# Patient Record
Sex: Female | Born: 1940 | Race: Black or African American | Hispanic: No | Smoking: Former smoker
Health system: Southern US, Community
[De-identification: ages and names within clinical notes are randomized; demographics above are authoritative.]

## PROBLEM LIST (undated history)

## (undated) DIAGNOSIS — R7309 Other abnormal glucose: Secondary | ICD-10-CM

## (undated) DIAGNOSIS — G4730 Sleep apnea, unspecified: Secondary | ICD-10-CM

## (undated) DIAGNOSIS — I10 Essential (primary) hypertension: Secondary | ICD-10-CM

## (undated) DIAGNOSIS — M60819 Other myositis, unspecified shoulder: Secondary | ICD-10-CM

## (undated) DIAGNOSIS — D649 Anemia, unspecified: Secondary | ICD-10-CM

## (undated) DIAGNOSIS — M25562 Pain in left knee: Secondary | ICD-10-CM

## (undated) DIAGNOSIS — R0982 Postnasal drip: Secondary | ICD-10-CM

## (undated) DIAGNOSIS — M5432 Sciatica, left side: Secondary | ICD-10-CM

## (undated) DIAGNOSIS — M150 Primary generalized (osteo)arthritis: Secondary | ICD-10-CM

## (undated) DIAGNOSIS — M170 Bilateral primary osteoarthritis of knee: Secondary | ICD-10-CM

## (undated) DIAGNOSIS — E7849 Other hyperlipidemia: Secondary | ICD-10-CM

## (undated) DIAGNOSIS — E559 Vitamin D deficiency, unspecified: Secondary | ICD-10-CM

## (undated) DIAGNOSIS — J309 Allergic rhinitis, unspecified: Secondary | ICD-10-CM

## (undated) DIAGNOSIS — G5601 Carpal tunnel syndrome, right upper limb: Secondary | ICD-10-CM

## (undated) DIAGNOSIS — G8929 Other chronic pain: Secondary | ICD-10-CM

## (undated) DIAGNOSIS — M542 Cervicalgia: Secondary | ICD-10-CM

## (undated) DIAGNOSIS — M5489 Other dorsalgia: Secondary | ICD-10-CM

## (undated) DIAGNOSIS — Z6834 Body mass index (BMI) 34.0-34.9, adult: Secondary | ICD-10-CM

## (undated) DIAGNOSIS — L989 Disorder of the skin and subcutaneous tissue, unspecified: Secondary | ICD-10-CM

## (undated) DIAGNOSIS — M353 Polymyalgia rheumatica: Secondary | ICD-10-CM

## (undated) DIAGNOSIS — M25569 Pain in unspecified knee: Secondary | ICD-10-CM

## (undated) DIAGNOSIS — M25552 Pain in left hip: Secondary | ICD-10-CM

## (undated) DIAGNOSIS — R011 Cardiac murmur, unspecified: Secondary | ICD-10-CM

## (undated) DIAGNOSIS — M7918 Myalgia, other site: Secondary | ICD-10-CM

## (undated) DIAGNOSIS — M1710 Unilateral primary osteoarthritis, unspecified knee: Secondary | ICD-10-CM

## (undated) DIAGNOSIS — M179 Osteoarthritis of knee, unspecified: Secondary | ICD-10-CM

## (undated) HISTORY — DX: Other dorsalgia: M54.89

## (undated) HISTORY — DX: Bilateral primary osteoarthritis of knee: M17.0

## (undated) HISTORY — DX: Cardiac murmur, unspecified: R01.1

## (undated) HISTORY — DX: Postnasal drip: R09.82

## (undated) HISTORY — DX: Primary generalized (osteo)arthritis: M15.0

## (undated) HISTORY — DX: Pain in left hip: M25.552

## (undated) HISTORY — DX: Polymyalgia rheumatica: M35.3

## (undated) HISTORY — DX: Pain in left knee: M25.562

## (undated) HISTORY — DX: Other abnormal glucose: R73.09

## (undated) HISTORY — DX: Body mass index (BMI) 34.0-34.9, adult: Z68.34

## (undated) HISTORY — DX: Osteoarthritis of knee, unspecified: M17.9

## (undated) HISTORY — DX: Disorder of the skin and subcutaneous tissue, unspecified: L98.9

## (undated) HISTORY — DX: Sciatica, left side: M54.32

## (undated) HISTORY — DX: Myalgia, other site: M79.18

## (undated) HISTORY — DX: Unilateral primary osteoarthritis, unspecified knee: M17.10

## (undated) HISTORY — DX: Sleep apnea, unspecified: G47.30

## (undated) HISTORY — DX: Other chronic pain: G89.29

## (undated) HISTORY — DX: Allergic rhinitis, unspecified: J30.9

## (undated) HISTORY — DX: Essential (primary) hypertension: I10

## (undated) HISTORY — DX: Anemia, unspecified: D64.9

## (undated) HISTORY — DX: Pain in unspecified knee: M25.569

## (undated) HISTORY — DX: Cervicalgia: M54.2

## (undated) HISTORY — DX: Vitamin D deficiency, unspecified: E55.9

## (undated) HISTORY — DX: Carpal tunnel syndrome, right upper limb: G56.01

## (undated) HISTORY — DX: Other myositis, unspecified shoulder: M60.819

## (undated) HISTORY — DX: Other hyperlipidemia: E78.49

---

## 2019-01-13 DIAGNOSIS — Z9289 Personal history of other medical treatment: Secondary | ICD-10-CM

## 2019-01-13 HISTORY — DX: Personal history of other medical treatment: Z92.89

## 2019-09-28 LAB — HM DEXA SCAN

## 2020-01-23 LAB — CBC: RBC: 3.24 — AB (ref 3.87–5.11)

## 2020-01-23 LAB — LIPID PANEL
Cholesterol: 197 (ref 0–200)
HDL: 78 — AB (ref 35–70)
LDL Cholesterol: 113
Triglycerides: 30 — AB (ref 40–160)

## 2020-01-23 LAB — BASIC METABOLIC PANEL
BUN: 14 (ref 4–21)
CO2: 27 — AB (ref 13–22)
Creatinine: 1.1 (ref 0.5–1.1)
Glucose: 93
Potassium: 4.6 (ref 3.4–5.3)

## 2020-01-23 LAB — CBC AND DIFFERENTIAL
HCT: 30 — AB (ref 36–46)
Hemoglobin: 10.1 — AB (ref 12.0–16.0)
Platelets: 182 (ref 150–399)
WBC: 3.3

## 2020-01-23 LAB — HEPATIC FUNCTION PANEL
ALT: 9 (ref 7–35)
AST: 23 (ref 13–35)
Alkaline Phosphatase: 57 (ref 25–125)

## 2020-01-23 LAB — COMPREHENSIVE METABOLIC PANEL
Albumin: 4 (ref 3.5–5.0)
Calcium: 9.3 (ref 8.7–10.7)
Globulin: 2.6

## 2020-01-23 LAB — VITAMIN D 25 HYDROXY (VIT D DEFICIENCY, FRACTURES): Vit D, 25-Hydroxy: 1.36

## 2020-02-01 LAB — HM MAMMOGRAPHY: HM Mammogram: NORMAL (ref 0–4)

## 2020-07-23 LAB — BASIC METABOLIC PANEL
BUN: 16 (ref 4–21)
CO2: 23 — AB (ref 13–22)
Chloride: 93 — AB (ref 99–108)
Creatinine: 1 (ref 0.5–1.1)
Glucose: 91
Potassium: 4.3 (ref 3.4–5.3)
Sodium: 133 — AB (ref 137–147)

## 2020-07-23 LAB — CBC AND DIFFERENTIAL
HCT: 29 — AB (ref 36–46)
Hemoglobin: 9.7 — AB (ref 12.0–16.0)
Platelets: 208 (ref 150–399)
WBC: 3.2

## 2020-07-23 LAB — HEPATIC FUNCTION PANEL
ALT: 10 (ref 7–35)
AST: 21 (ref 13–35)
Alkaline Phosphatase: 50 (ref 25–125)

## 2020-07-23 LAB — CBC: RBC: 3.03 — AB (ref 3.87–5.11)

## 2020-07-23 LAB — LIPID PANEL
Cholesterol: 182 (ref 0–200)
HDL: 79 — AB (ref 35–70)
LDL Cholesterol: 96
Triglycerides: 34 — AB (ref 40–160)

## 2020-07-23 LAB — COMPREHENSIVE METABOLIC PANEL
Calcium: 9.3 (ref 8.7–10.7)
Globulin: 2.9

## 2020-12-07 ENCOUNTER — Emergency Department (HOSPITAL_BASED_OUTPATIENT_CLINIC_OR_DEPARTMENT_OTHER): Payer: Medicare Other

## 2020-12-07 ENCOUNTER — Emergency Department (HOSPITAL_BASED_OUTPATIENT_CLINIC_OR_DEPARTMENT_OTHER)
Admission: EM | Admit: 2020-12-07 | Discharge: 2020-12-07 | Disposition: A | Discharge status: Home / Self Care | Payer: Medicare Other | Attending: Emergency Medicine | Admitting: Emergency Medicine

## 2020-12-07 ENCOUNTER — Encounter (HOSPITAL_BASED_OUTPATIENT_CLINIC_OR_DEPARTMENT_OTHER): Payer: Self-pay | Admitting: Emergency Medicine

## 2020-12-07 DIAGNOSIS — Z87891 Personal history of nicotine dependence: Secondary | ICD-10-CM | POA: Insufficient documentation

## 2020-12-07 DIAGNOSIS — R443 Hallucinations, unspecified: Secondary | ICD-10-CM | POA: Diagnosis not present

## 2020-12-07 DIAGNOSIS — D72819 Decreased white blood cell count, unspecified: Secondary | ICD-10-CM | POA: Diagnosis not present

## 2020-12-07 DIAGNOSIS — R4182 Altered mental status, unspecified: Secondary | ICD-10-CM | POA: Insufficient documentation

## 2020-12-07 DIAGNOSIS — R7309 Other abnormal glucose: Secondary | ICD-10-CM | POA: Diagnosis not present

## 2020-12-07 DIAGNOSIS — R410 Disorientation, unspecified: Secondary | ICD-10-CM | POA: Insufficient documentation

## 2020-12-07 DIAGNOSIS — R791 Abnormal coagulation profile: Secondary | ICD-10-CM | POA: Diagnosis not present

## 2020-12-07 LAB — URINALYSIS, ROUTINE W REFLEX MICROSCOPIC
Bilirubin Urine: NEGATIVE
Glucose, UA: NEGATIVE mg/dL
Hgb urine dipstick: NEGATIVE
Ketones, ur: NEGATIVE mg/dL
Leukocytes,Ua: NEGATIVE
Nitrite: NEGATIVE
Protein, ur: NEGATIVE mg/dL
Specific Gravity, Urine: 1.02 (ref 1.005–1.030)
pH: 7 (ref 5.0–8.0)

## 2020-12-07 LAB — COMPREHENSIVE METABOLIC PANEL
ALT: 11 U/L (ref 0–44)
AST: 20 U/L (ref 15–41)
Albumin: 3.7 g/dL (ref 3.5–5.0)
Alkaline Phosphatase: 41 U/L (ref 38–126)
Anion gap: 9 (ref 5–15)
BUN: 24 mg/dL — ABNORMAL HIGH (ref 8–23)
CO2: 25 mmol/L (ref 22–32)
Calcium: 9.5 mg/dL (ref 8.9–10.3)
Chloride: 98 mmol/L (ref 98–111)
Creatinine, Ser: 1.14 mg/dL — ABNORMAL HIGH (ref 0.44–1.00)
GFR, Estimated: 48 mL/min — ABNORMAL LOW (ref >60)
Glucose, Bld: 107 mg/dL — ABNORMAL HIGH (ref 70–99)
Potassium: 3.4 mmol/L — ABNORMAL LOW (ref 3.5–5.1)
Sodium: 132 mmol/L — ABNORMAL LOW (ref 135–145)
Total Bilirubin: 0.4 mg/dL (ref 0.3–1.2)
Total Protein: 7.4 g/dL (ref 6.5–8.1)

## 2020-12-07 LAB — PROTIME-INR
INR: 1.3 — ABNORMAL HIGH (ref 0.8–1.2)
Prothrombin Time: 16.1 seconds — ABNORMAL HIGH (ref 11.4–15.2)

## 2020-12-07 LAB — CBC
HCT: 34.3 % — ABNORMAL LOW (ref 36.0–46.0)
Hemoglobin: 11.5 g/dL — ABNORMAL LOW (ref 12.0–15.0)
MCH: 31.3 pg (ref 26.0–34.0)
MCHC: 33.5 g/dL (ref 30.0–36.0)
MCV: 93.5 fL (ref 80.0–100.0)
Platelets: 169 10*3/uL (ref 150–400)
RBC: 3.67 MIL/uL — ABNORMAL LOW (ref 3.87–5.11)
RDW: 12.6 % (ref 11.5–15.5)
WBC: 3.1 10*3/uL — ABNORMAL LOW (ref 4.0–10.5)
nRBC: 0 % (ref 0.0–0.2)

## 2020-12-07 LAB — TROPONIN I (HIGH SENSITIVITY)
Troponin I (High Sensitivity): 5 ng/L (ref <18)
Troponin I (High Sensitivity): 5 ng/L (ref <18)

## 2020-12-07 LAB — CBG MONITORING, ED
Glucose-Capillary: 57 mg/dL — ABNORMAL LOW (ref 70–99)
Glucose-Capillary: 88 mg/dL (ref 70–99)

## 2020-12-07 NOTE — ED Notes (Signed)
son reports that patient has been living with him for 2 weeks. Reports she forgets names, has episodes of confusion. Crying episodes and behavior becomes worse at hs

## 2020-12-07 NOTE — Discharge Instructions (Addendum)
Your laboratory results are within normal limits today.  We discussed the results of your CT, x-rays at length.  Need to schedule an appointment with your primary care physician in order to follow-up for further management of your mental status.

## 2020-12-07 NOTE — ED Triage Notes (Signed)
Pt's family reports she is confused, wandering in the house continuously, possible hallucinations; she lives by herself in another town; family sts she was at her baseline about one month ago

## 2020-12-07 NOTE — ED Provider Notes (Signed)
[facility] EMERGENCY DEPARTMENT Provider Note   CSN: [id number] Arrival date & time: 12/07/20  1132     History Chief Complaint  Patient presents with   Altered Mental Status    Carla Gregory is a 80 y.o. female.  80 y.o female with a PMH of hypertension presents to the ED right in by son Carla Gregory for confusion, wandering the house, possible hallucinations for the past month.  According to son, patient is visiting in town and has been staying with him for the past 2 weeks.  Her behavior seems to be waxing and waning throughout the hours of the day, behavior worsens after the sun goes down.  She has been wandering the house, asking where the bathroom is when she knows the bathroom is across from her bedroom.  Other history was obtained from daughter-in-law Carla Gregory on the phone, she reports patient currently lives alone in [place] city, has had episodes of sleeping for an hour, waking up all night pacing the house.  States that she was evaluated by PCP approximately 1 month ago but had no abnormalities found then.  According to daughter law she does not have knowledge of any medical history of her mother-in-law, but during the last 2 weeks she denies any fever, sick contacts, trauma.  Carla Gregory is a non-smoker, according to Meds reviewed the bedside she is currently on Eliquis.        The history is provided by the patient and medical records.  Altered Mental Status Presenting symptoms: behavior changes and disorientation   Associated symptoms: no abdominal pain, no fever and no light-headedness       History reviewed. No pertinent past medical history.  There are no problems to display for this patient.   History reviewed. No pertinent surgical history.   OB History   No obstetric history on file.     No family history on file.  Social History   Tobacco Use   Smoking status: Former    Types: Cigarettes   Smokeless tobacco: Never  Substance Use Topics    Alcohol use: Not Currently   Drug use: Not Currently    Home Medications Prior to Admission medications   Not on File    Allergies    Shellfish allergy  Review of Systems   Review of Systems  Unable to perform ROS: Other  Constitutional:  Negative for fever.  Respiratory:  Negative for shortness of breath.   Cardiovascular:  Negative for chest pain.  Gastrointestinal:  Negative for abdominal pain.  Genitourinary:  Negative for difficulty urinating.  Neurological:  Negative for light-headedness.  All other systems reviewed and are negative.  Physical Exam Updated Vital Signs BP 110/78   Pulse (!) 55   Temp 98.3 F (36.8 C) (Oral)   Resp 16   Ht 5' (1.524 m)   Wt 63.5 kg   SpO2 90%   BMI 27.34 kg/m   Physical Exam Vitals and nursing note reviewed.  Constitutional:      Appearance: Normal appearance.  HENT:     Head: Normocephalic and atraumatic.     Comments: NO signs of visible trauma.     Mouth/Throat:     Mouth: Mucous membranes are dry.  Eyes:     Pupils: Pupils are equal, round, and reactive to light.  Cardiovascular:     Rate and Rhythm: Normal rate.     Comments: No bilateral pitting edema or calf tenderness. Pulmonary:     Comments: Lungs are clear to  auscultation without any rales or wheezing noted. Abdominal:     General: Abdomen is flat.     Tenderness: There is no abdominal tenderness. There is no right CVA tenderness or left CVA tenderness.  Musculoskeletal:     Cervical back: Normal range of motion and neck supple.  Skin:    General: Skin is warm and dry.  Neurological:     Mental Status: She is alert. She is disoriented.     Comments: Alert, somewhat oriented.  Slow to respond, follows commands appropriately. Alert, oriented, thought content appropriate. Speech fluent without evidence of aphasia. Able to follow 2 step commands without difficulty.  Cranial Nerves:  II:  Peripheral visual fields grossly normal, pupils, round, reactive to  light III,IV, VI: ptosis not present, extra-ocular motions intact bilaterally  V,VII: smile symmetric, facial light touch sensation equal VIII: hearing grossly normal bilaterally  IX,X: midline uvula rise  XI: bilateral shoulder shrug equal and strong XII: midline tongue extension  Motor:  5/5 in upper and lower extremities bilaterally including strong and equal grip strength and dorsiflexion/plantar flexion Sensory: light touch normal in all extremities.  Cerebellar: normal finger-to-nose with bilateral upper extremities, pronator drift negative       ED Results / Procedures / Treatments   Labs (all labs ordered are listed, but only abnormal results are displayed) Labs Reviewed  COMPREHENSIVE METABOLIC PANEL - Abnormal; Notable for the following components:      Result Value   Sodium 132 (*)    Potassium 3.4 (*)    Glucose, Bld 107 (*)    BUN 24 (*)    Creatinine, Ser 1.14 (*)    GFR, Estimated 48 (*)    All other components within normal limits  CBC - Abnormal; Notable for the following components:   WBC 3.1 (*)    RBC 3.67 (*)    Hemoglobin 11.5 (*)    HCT 34.3 (*)    All other components within normal limits  PROTIME-INR - Abnormal; Notable for the following components:   Prothrombin Time 16.1 (*)    INR 1.3 (*)    All other components within normal limits  CBG MONITORING, ED - Abnormal; Notable for the following components:   Glucose-Capillary 57 (*)    All other components within normal limits  URINALYSIS, ROUTINE W REFLEX MICROSCOPIC  TSH  CBG MONITORING, ED  TROPONIN I (HIGH SENSITIVITY)  TROPONIN I (HIGH SENSITIVITY)    EKG None  Radiology DG Chest 2 View  Result Date: 12/07/2020 CLINICAL DATA:  Altered mental status. EXAM: CHEST - 2 VIEW COMPARISON:  None. FINDINGS: Mildly enlarged cardiac silhouette with tortuous thoracic aorta. Aortic calcifications. Trace pleural fluid versus pleural thickening at the posterior costophrenic angle. No focal  consolidation or pneumothorax. No acute osseous abnormality. IMPRESSION: 1. Trace pleural fluid versus pleural thickening at the posterior costophrenic angle. The lungs are otherwise clear. 2. Mild cardiomegaly and tortuous thoracic aorta. Aortic Atherosclerosis (ICD10-I70.0). Electronically Signed   By: Sherron Ales M.D.   On: 12/07/2020 13:20   CT Head Wo Contrast  Result Date: 12/07/2020 CLINICAL DATA:  Altered mental status EXAM: CT HEAD WITHOUT CONTRAST TECHNIQUE: Contiguous axial images were obtained from the base of the skull through the vertex without intravenous contrast. COMPARISON:  None. FINDINGS: Brain: No acute findings are seen. Cortical sulci are prominent. There is decreased density in the periventricular white matter. There are small calcifications in the basal ganglia. Vascular: There are scattered arterial calcifications. Skull: Unremarkable. Sinuses/Orbits: Unremarkable. Other: There  is narrowing of AP diameter of cervical spinal canal at C2 level, more so on the left side. This finding is not fully evaluated. IMPRESSION: There are no signs of bleeding within the cranium. Atrophy. Small-vessel disease. There is narrowing of AP diameter of upper cervical spinal canal at C2 level suggesting spinal stenosis, more so on the left side. This finding is not fully evaluated. If clinically warranted, follow-up CT or MRI of cervical spine may be considered. Electronically Signed   By: Ernie Avena M.D.   On: 12/07/2020 15:45    Procedures Procedures   Medications Ordered in ED Medications - No data to display  ED Course  I have reviewed the triage vital signs and the nursing notes.  Pertinent labs & imaging results that were available during my care of the patient were reviewed by me and considered in my medical decision making (see chart for details).    MDM Rules/Calculators/A&P   Patient with unknown medical history presents to the ED complaining by son Carla Gregory, reports she  has had some changes in mental status for the past month.  Patient is currently visiting and has been staying with them for the past 2 weeks.  They report she has unusual behavior, more sleepy than usual, usually wonders the house, is up all night but refuses to sleep.  During the time that she has been with him she has not had any falls, no sick contacts, fevers, no coughs.  According to son, he is concerned for UTI.  She does not have any prior history of dementia or has not been diagnosed in the past.  Collateral information obtained from daughter-in-law Carla Gregory on the phone, who reports patient has been more difficult to redirect, seems that she is confused and where she is every time she wakes up.  Her symptoms do worsen at night.  Unaware of her medical history.  On arrival vitals are within normal limits, she does have a history of hypertension as noted per her prescription for losartan.  She is also taking Eliquis, she is unsure why she is taking this medication, she moves all upper and lower extremities without any facial asymmetry, no dysarthria, her neuro exam is unremarkable.  She does appear somewhat frail, does take some time to respond to questions, is somewhat tangential.  Workup initiated for altered mental status.  Labs reviewed by me on today's visit, CBC remarkable for leukopenia.  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  BMP without any electrolyte derangement, does have some decreased levels in her potassium and sodium.  Creatinine is slightly elevated at 1.1, did appear dry, some surgeon for dehydration.  LFTs are within normal limits.  Troponin x2 are within normal limits.  Her CBG was 88 on arrival.  UA without any nitrites or leukocytes or signs of infection.  DG Chest:  1. Trace pleural fluid versus pleural thickening at the posterior  costophrenic angle. The lungs are otherwise clear.  2. Mild cardiomegaly and  tortuous thoracic aorta. Aortic  Atherosclerosis (ICD10-I70.0).      Results were discussed with son Carla Gregory at the bedside along with wife Carla Gregory on the phone.  We also added a TSH although she does not have any prior history of thyroid disease.  I suspect this patient's symptoms have been ongoing for some time now.  However she is on Eliquis, was living alone at home, will check for any prior intracranial pathology with CT.  CT is without any acute finding on today's visit.   There are no signs of bleeding within the cranium. Atrophy.  Small-vessel disease.     There is narrowing of AP diameter of upper cervical spinal canal at  C2 level suggesting spinal stenosis, more so on the left side. This  finding is not fully evaluated. If clinically warranted, follow-up  CT or MRI of cervical spine may be considered.         Results were discussed at length with son Carla Gregory, son Feliz Beam on the phone, her daughter-in-law Carla Gregory.  They are aware that they will need to follow-up with neurology for a follow-up and worsening mental status for the last couple of months.  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  Family is agreeable to plan and treatment at this time, patient is discharged in stable condition.   Portions of this note were generated with Scientist, clinical (histocompatibility and immunogenetics). Dictation errors may occur despite best attempts at proofreading.  Final Clinical Impression(s) / ED Diagnoses Final diagnoses:  Disorientation    Rx / DC Orders ED Discharge Orders     None        Claude Manges, PA-C 12/07/20 1631    Ernie Avena, MD 12/07/20 1722

## 2020-12-08 LAB — TSH: TSH: 1.475 u[IU]/mL (ref 0.350–4.500)

## 2020-12-12 NOTE — Progress Notes (Signed)
Assessment/Plan:   Carla Gregory is a 80 y.o. year old female with risk factors including  age, hypertension, hyperlipidemia, possible history of CHF, chronic Eliquis (Afib vs prior DVT?)  seen today for evaluation of Dementia. Unable to perform MoCA, MMSE 19/30 . Suspect Lewy Body, however her  current mental status may be exacerbated by metabolic issues. She has no recent PCP or cardiac care .     Recommendations:   Dementia with Behavioral Disturbance  MRI brain without contrast to assess for underlying structural abnormality and assess vascular load  Referral to Cardiology for evaluation of CHF and other cardiological abnormalities.  Check B12, TSH Discussed safety both in and out of the home.  Discussed the importance of regular daily schedule with inclusion of crossword puzzles to maintain brain function.  Needs to establish care with PCP for mood and other primary medical issues   Naps should be scheduled and should be no longer than 60 minutes and should not occur after 2 PM.  Mediterranean diet is recommended  Folllow up in 3 months after patient has been seen by Cards and PCP, at which time, will  reassess and consider [facility] and other workup if indicated.   Subjective:    The patient is seen in neurologic consultation at the request of No ref. provider found for the evaluation of memory.  The patient is accompanied by her son who supplements the history.  This is a 80 y.o. year old right-handed female who was brought from [place] (where she lives alone), to be with her son at the end of November 2022, in view of increased confusion noted by him.  Carla Gregory noticed that she was more confused, wondering around the house, intermittently crying, and demonstrating hallucinations.  She has been with him for the last 3 weeks.  Her behavior seems to be waxing and waning through the hours of the day, but worsens at night.  For example, she becomes disoriented to  where the bathroom is, or how to operate the phone. At night, she wakes up and paces around the house.   Early this year, she began showing some signs, calling different family members different names.  She has some hallucinations, stating that there is some children sitting next to her when no one is there, or stating that someone is sleeping in bed with her or is trying to climb through the window.  At times, she becomes argumentative, especially at night.  She denies vivid dreams or sleepwalking.  It is unclear if she has paranoia.  She attributed this to "getting older".  She does repeat the same stories and asked the same questions.  When they went to visit her, her house was hoarded "and she always had been a very clean person".  Lately, she is very afraid of the shower, and at times she does not know how to change her clothing or underwear.  "She dresses backwards ".  She also shows very mild right greater than left intention tremors not noted earlier this year. No apparent fever, sick contacts or trauma.  She denies a history of depression.  She no longer drives.  Her medications are given by her daughter-in-law.  One son takes care of the finances.  Appetite is fair, she craves for sweets and likes to chew gum.  She denies trouble swallowing.  She does not cook.  She ambulates without difficulties, does not use a walker or a cane.  Denies head injuries.  She denies headaches, double vision, dizziness, focal numbness or tingling, or unilateral weakness.  No history of seizures.  Denies urine incontinence, retention, constipation or diarrhea.  No history of sleep apnea, alcohol or tobacco.  Family history unknown.  Of note, in looking back at old her history in care everywhere, it was noted that she is on chronic Eliquis, at least going back to 2020, and patient and family do not know the specifics of the recent taking this med.  Other findings are suspicious for congestive heart failure as well.  She had a  recent history of hyponatremia prior to coming to [place].  Moreover, her most recent chest x-ray does show cardiomegaly.  She denies a history of atrial fibrillation, DVT or PE.    CT Brain 11.26.22 wo contrast : No acute findings are seen. Cortical sulci are prominent.There is decreased density in the periventricular white matter.There are small calcifications in the basal ganglia.    Labs: NA 132 12/07/20  Ca normal 9.5  Hb 11.5 Hct 34.3, WBC 3.1, plt 169 TSH 1.475 normal  Allergies  Allergen Reactions   Shellfish Allergy Hives    Current Outpatient Medications  Medication Instructions   acetaminophen (TYLENOL) 500 MG tablet Oral   Acetaminophen-Codeine 300-30 MG tablet 1 tablet, Oral, As needed   baclofen (LIORESAL) 10 MG tablet Oral   Cholecalciferol 50 MCG (2000 UT) TABS Oral   diclofenac Sodium (VOLTAREN) 2 g, Topical, 4 times daily   Eliquis 5 mg, Oral, 2 times daily   ferrous sulfate 325 (65 FE) MG tablet Oral   losartan (COZAAR) 100 mg, Oral, Daily   rosuvastatin (CRESTOR) 10 MG tablet Oral   spironolactone-hydrochlorothiazide (ALDACTAZIDE) 25-25 MG tablet 1 tablet, Oral, Daily     VITALS:   Vitals:   12/13/20 1514  BP: 124/68  Pulse: (!) 114  Resp: 18  SpO2: 96%  Weight: 123 lb (55.8 kg)  Height: 5\' 3"  (1.6 m)   No flowsheet data found.  PHYSICAL EXAM   HEENT:  Normocephalic, atraumatic. The mucous membranes are moist. The superficial temporal arteries are without ropiness or tenderness. Cardiovascular: Regular rate and rhythm.  Right lower extremity with edema, no calf tenderness Lungs: Clear to auscultation bilaterally. Neck: There are no carotid bruits noted bilaterally.  NEUROLOGICAL: No flowsheet data found. MMSE - Mini Mental State Exam 12/13/2020  Orientation to time 2  Orientation to Place 0  Registration 3  Attention/ Calculation 4  Recall 2  Language- name 2 objects 2  Language- repeat 1  Language- follow 3 step command 3  Language-  read & follow direction 1  Write a sentence 1  Copy design 0  Total score 19    No flowsheet data found.   Orientation:  Alert and oriented to person, not to place or time no aphasia or dysarthria. Fund of knowledge is reduced. Recent and remote memory impaired.  Attention and concentration are normal.  Able to name objects and repeat phrases. Delayed recall 2/3 Cranial nerves: There is good facial symmetry. Extraocular muscles are intact and visual fields are full to confrontational testing. Speech is fluent and clear. Soft palate rises symmetrically and there is no tongue deviation. Hearing is intact to conversational tone. Tone: Tone is good throughout. No Cogwheeling Sensation: Sensation is intact to light touch and pinprick throughout. Vibration is intact at the bilateral big toe.There is no extinction with double simultaneous stimulation. There is no sensory dermatomal level identified. Coordination: The patient has no difficulty with RAM's or  FNF bilaterally. Normal finger to nose  Motor: Strength is 5/5 in the bilateral upper and lower extremities. There is no pronator drift. There are no fasciculations noted. Mild L>R end point tremor.  DTR's: Deep tendon reflexes are 2/4 at the bilateral biceps, triceps, brachioradialis, patella and achilles.  Plantar responses are downgoing bilaterally.  Gait and Station: The patient is able to ambulate without difficulty.The patient is able to heel toe walk without any difficulty.The patient is able to ambulate in a tandem fashion. The patient is able to stand in the Romberg position.     Thank you for allowing [place] the opportunity to participate in the care of this nice patient. Please do not hesitate to contact us for any questions or concerns.   Total time spent on today's visit was 70 minutes, including both face-to-face time and nonface-to-face time.  Time included that spent on review of records (prior notes available to me/labs/imaging if  pertinent), discussing treatment and goals, answering patient's questions and coordinating care.  Cc:  Pcp, No  Marlowe Kays 12/16/2020 7:21 AM

## 2020-12-13 ENCOUNTER — Encounter: Payer: Self-pay | Admitting: Physician Assistant

## 2020-12-13 ENCOUNTER — Other Ambulatory Visit: Payer: Self-pay

## 2020-12-13 ENCOUNTER — Ambulatory Visit (INDEPENDENT_AMBULATORY_CARE_PROVIDER_SITE_OTHER): Payer: Medicare Other | Admitting: Physician Assistant

## 2020-12-13 VITALS — BP 124/68 | HR 114 | Resp 18 | Ht 63.0 in | Wt 123.0 lb

## 2020-12-13 DIAGNOSIS — R2241 Localized swelling, mass and lump, right lower limb: Secondary | ICD-10-CM | POA: Diagnosis not present

## 2020-12-13 DIAGNOSIS — E871 Hypo-osmolality and hyponatremia: Secondary | ICD-10-CM | POA: Diagnosis not present

## 2020-12-13 DIAGNOSIS — F03918 Unspecified dementia, unspecified severity, with other behavioral disturbance: Secondary | ICD-10-CM

## 2020-12-13 DIAGNOSIS — I517 Cardiomegaly: Secondary | ICD-10-CM

## 2020-12-13 DIAGNOSIS — R413 Other amnesia: Secondary | ICD-10-CM

## 2020-12-13 NOTE — Patient Instructions (Addendum)
It was a pleasure to see you today at our office.   Recommendations:  MRI of the brain, the radiology office will call you to arrange you appointment Check labs today Referral T Cardiology Establish care with Primary as soon as possible for all medical needs   RECOMMENDATIONS FOR ALL PATIENTS WITH MEMORY PROBLEMS: 1. Continue to exercise (Recommend 30 minutes of walking everyday, or 3 hours every week) 2. Increase social interactions - continue going to [place] and enjoy social gatherings with friends and family 3. Eat healthy, avoid fried foods and eat more fruits and vegetables 4. Maintain adequate blood pressure, blood sugar, and blood cholesterol level. Reducing the risk of stroke and cardiovascular disease also helps promoting better memory. 5. Avoid stressful situations. Live a simple life and avoid aggravations. Organize your time and prepare for the next day in anticipation. 6. Sleep well, avoid any interruptions of sleep and avoid any distractions in the bedroom that may interfere with adequate sleep quality 7. Avoid sugar, avoid sweets as there is a strong link between excessive sugar intake, diabetes, and cognitive impairment We discussed the Mediterranean diet, which has been shown to help patients reduce the risk of progressive memory disorders and reduces cardiovascular risk. This includes eating fish, eat fruits and green leafy vegetables, nuts like almonds and hazelnuts, walnuts, and also use olive oil. Avoid fast foods and fried foods as much as possible. Avoid sweets and sugar as sugar use has been linked to worsening of memory function.  There is always a concern of gradual progression of memory problems. If this is the case, then we may need to adjust level of care according to patient needs. Support, both to the patient and caregiver, should then be put into place.      You have been referred for a neuropsychological evaluation (i.e., evaluation of memory and thinking  abilities). Please bring someone with you to this appointment if possible, as it is helpful for the doctor to hear from both you and another adult who knows you well. Please bring eyeglasses and hearing aids if you wear them.    The evaluation will take approximately 3 hours and has two parts:   The first part is a clinical interview with the neuropsychologist (Dr. Milbert Coulter or Dr. Roseanne Reno). During the interview, the neuropsychologist will speak with you and the individual you brought to the appointment.    The second part of the evaluation is testing with the doctor's technician Annabelle Harman or Selena Batten). During the testing, the technician will ask you to remember different types of material, solve problems, and answer some questionnaires. Your family member will not be present for this portion of the evaluation.   Please note: We must reserve several hours of the neuropsychologist's time and the psychometrician's time for your evaluation appointment. As such, there is a No-Show fee of $100. If you are unable to attend any of your appointments, please contact our office as soon as possible to reschedule.    FALL PRECAUTIONS: Be cautious when walking. Scan the area for obstacles that may increase the risk of trips and falls. When getting up in the mornings, sit up at the edge of the bed for a few minutes before getting out of bed. Consider elevating the bed at the head end to avoid drop of blood pressure when getting up. Walk always in a well-lit room (use night lights in the walls). Avoid area rugs or power cords from appliances in the middle of the walkways. Use a walker  or a cane if necessary and consider physical therapy for balance exercise. Get your eyesight checked regularly.  FINANCIAL OVERSIGHT: Supervision, especially oversight when making financial decisions or transactions is also recommended.  HOME SAFETY: Consider the safety of the kitchen when operating appliances like stoves, microwave oven, and  blender. Consider having supervision and share cooking responsibilities until no longer able to participate in those. Accidents with firearms and other hazards in the house should be identified and addressed as well.   ABILITY TO BE LEFT ALONE: If patient is unable to contact 911 operator, consider using LifeLine, or when the need is there, arrange for someone to stay with patients. Smoking is a fire hazard, consider supervision or cessation. Risk of wandering should be assessed by caregiver and if detected at any point, supervision and safe proof recommendations should be instituted.  MEDICATION SUPERVISION: Inability to self-administer medication needs to be constantly addressed. Implement a mechanism to ensure safe administration of the medications.   DRIVING: Regarding driving, in patients with progressive memory problems, driving will be impaired. We advise to have someone else do the driving if trouble finding directions or if minor accidents are reported. Independent driving assessment is available to determine safety of driving.   If you are interested in the driving assessment, you can contact the following:  [employer] in [place]  [place] [phone] or [phone]    [place] refers to food and lifestyle choices that are based on the traditions of countries located on the [employer]. This way of eating has been shown to help prevent certain conditions and improve outcomes for people who have chronic diseases, like kidney disease and heart disease. What are tips for following this plan? Lifestyle  Cook and eat meals together with your family, when possible. Drink enough fluid to keep your urine clear or pale yellow. Be physically active every day. This includes: Aerobic exercise like running or swimming. Leisure  activities like gardening, walking, or housework. Get 7-8 hours of sleep each night. If recommended by your health care provider, drink red wine in moderation. This means 1 glass a day for nonpregnant women and 2 glasses a day for men. A glass of wine equals 5 oz (150 mL). Reading food labels  Check the serving size of packaged foods. For foods such as rice and pasta, the serving size refers to the amount of cooked product, not dry. Check the total fat in packaged foods. Avoid foods that have saturated fat or trans fats. Check the ingredients list for added sugars, such as corn syrup. Shopping  At the grocery store, buy most of your food from the areas near the walls of the store. This includes: Fresh fruits and vegetables (produce). Grains, beans, nuts, and seeds. Some of these may be available in unpackaged forms or large amounts (in bulk). Fresh seafood. Poultry and eggs. Low-fat dairy products. Buy whole ingredients instead of prepackaged foods. Buy fresh fruits and vegetables in-season from local farmers markets. Buy frozen fruits and vegetables in resealable bags. If you do not have access to quality fresh seafood, buy precooked frozen shrimp or canned fish, such as tuna, salmon, or sardines. Buy small amounts of raw or cooked vegetables, salads, or olives from the deli or salad bar at your store. Stock your pantry so you always have certain foods on hand, such as olive oil, canned tuna, canned tomatoes, rice, pasta, and beans.  Cooking  Cook foods with extra-virgin olive oil instead of using butter or other vegetable oils. Have meat as a side dish, and have vegetables or grains as your main dish. This means having meat in small portions or adding small amounts of meat to foods like pasta or stew. Use beans or vegetables instead of meat in common dishes like chili or lasagna. Experiment with different cooking methods. Try roasting or broiling vegetables instead of steaming or sauteing  them. Add frozen vegetables to soups, stews, pasta, or rice. Add nuts or seeds for added healthy fat at each meal. You can add these to yogurt, salads, or vegetable dishes. Marinate fish or vegetables using olive oil, lemon juice, garlic, and fresh herbs. Meal planning  Plan to eat 1 vegetarian meal one day each week. Try to work up to 2 vegetarian meals, if possible. Eat seafood 2 or more times a week. Have healthy snacks readily available, such as: Vegetable sticks with hummus. Greek yogurt. Fruit and nut trail mix. Eat balanced meals throughout the week. This includes: Fruit: 2-3 servings a day Vegetables: 4-5 servings a day Low-fat dairy: 2 servings a day Fish, poultry, or lean meat: 1 serving a day Beans and legumes: 2 or more servings a week Nuts and seeds: 1-2 servings a day Whole grains: 6-8 servings a day Extra-virgin olive oil: 3-4 servings a day Limit red meat and sweets to only a few servings a month What are my food choices? Mediterranean diet Recommended Grains: Whole-grain pasta. Brown rice. Bulgar wheat. Polenta. Couscous. Whole-wheat bread. [place]. Vegetables: Artichokes. Beets. Broccoli. Cabbage. Carrots. Eggplant. Green beans. Chard. Kale. Spinach. Onions. Leeks. Peas. Squash. Tomatoes. Peppers. Radishes. Fruits: Apples. Apricots. Avocado. Berries. Bananas. Cherries. Dates. Figs. Grapes. Lemons. Melon. Oranges. Peaches. Plums. Pomegranate. Meats and other protein foods: Beans. Almonds. Sunflower seeds. Pine nuts. Peanuts. [place]. Salmon. Scallops. Shrimp. [place]. Tilapia. Clams. Oysters. Eggs. Dairy: Low-fat milk. Cheese. Greek yogurt. Beverages: Water. Red wine. Herbal tea. Fats and oils: Extra virgin olive oil. Avocado oil. Grape seed oil. Sweets and desserts: [place] yogurt with honey. Baked apples. Poached pears. Trail mix. Seasoning and other foods: Basil. Cilantro. Coriander. Cumin. Mint. Parsley. Sage. Rosemary. Tarragon. Garlic. Oregano. Thyme. Pepper.  Balsalmic vinegar. Tahini. Hummus. Tomato sauce. Olives. Mushrooms. Limit these Grains: Prepackaged pasta or rice dishes. Prepackaged cereal with added sugar. Vegetables: Deep fried potatoes (french fries). Fruits: Fruit canned in syrup. Meats and other protein foods: Beef. Pork. Lamb. Poultry with skin. Hot dogs. [place] Salines. Dairy: Ice cream. Sour cream. Whole milk. Beverages: Juice. Sugar-sweetened soft drinks. Beer. Liquor and spirits. Fats and oils: Butter. Canola oil. Vegetable oil. Beef fat (tallow). Lard. Sweets and desserts: Cookies. Cakes. Pies. Candy. Seasoning and other foods: Mayonnaise. Premade sauces and marinades. The items listed may not be a complete list. Talk with your dietitian about what dietary choices are right for you. Summary The Mediterranean diet includes both food and lifestyle choices. Eat a variety of fresh fruits and vegetables, beans, nuts, seeds, and whole grains. Limit the amount of red meat and sweets that you eat. Talk with your health care provider about whether it is safe for you to drink red wine in moderation. This means 1 glass a day for nonpregnant women and 2 glasses a day for men. A glass of wine equals 5 oz (150 mL). This information is not intended to replace advice given to you by your health care provider. Make sure you discuss any questions you have with your health care provider. Document Released:  08/22/2015 Document Revised: 09/24/2015 Document Reviewed: 08/22/2015 Elsevier Interactive Patient Education  2017 [employer].  We have sent a referral to [facility] for your MRI and they will call you directly to schedule your appointment. They are located at [place]. If you need to contact them directly please call [phone].   Your provider has requested that you have labwork completed today. Please go to [facility] ([id number]) on the second floor of this building before leaving the office today. You do not need to  check in. If you are not called within 15 minutes please check with the front desk.

## 2020-12-27 ENCOUNTER — Other Ambulatory Visit: Payer: Self-pay

## 2020-12-27 ENCOUNTER — Other Ambulatory Visit (INDEPENDENT_AMBULATORY_CARE_PROVIDER_SITE_OTHER): Payer: Medicare Other

## 2020-12-27 DIAGNOSIS — R413 Other amnesia: Secondary | ICD-10-CM

## 2020-12-27 LAB — TSH: TSH: 1.3 u[IU]/mL (ref 0.35–5.50)

## 2020-12-27 LAB — VITAMIN B12: Vitamin B-12: 455 pg/mL (ref 211–911)

## 2021-01-09 ENCOUNTER — Ambulatory Visit: Payer: Medicare Other | Admitting: Cardiology

## 2021-01-14 ENCOUNTER — Other Ambulatory Visit: Payer: Self-pay | Admitting: Physician Assistant

## 2021-01-14 ENCOUNTER — Telehealth: Payer: Self-pay | Admitting: Physician Assistant

## 2021-01-14 MED ORDER — DIAZEPAM 5 MG PO TABS
ORAL_TABLET | ORAL | 0 refills | Status: DC
Start: 2021-01-14 — End: 2021-07-01

## 2021-01-14 NOTE — Telephone Encounter (Signed)
Neoma Laming left a voice message that she needs to speak to someone about a MRI  please call

## 2021-01-14 NOTE — Telephone Encounter (Signed)
Sent to pharmacy Valium 5 mg , take 1 tab 30 mins prior to the MRI, take a second tab if not therapeutic. Monitor for drowsiness.

## 2021-01-16 ENCOUNTER — Ambulatory Visit (INDEPENDENT_AMBULATORY_CARE_PROVIDER_SITE_OTHER): Payer: Medicare Other | Admitting: Family

## 2021-01-16 ENCOUNTER — Encounter: Payer: Self-pay | Admitting: Family

## 2021-01-16 ENCOUNTER — Other Ambulatory Visit: Payer: Self-pay

## 2021-01-16 VITALS — BP 110/70 | HR 57 | Temp 97.3°F | Resp 16 | Ht 60.0 in | Wt 133.4 lb

## 2021-01-16 DIAGNOSIS — I5022 Chronic systolic (congestive) heart failure: Secondary | ICD-10-CM | POA: Diagnosis not present

## 2021-01-16 DIAGNOSIS — I482 Chronic atrial fibrillation, unspecified: Secondary | ICD-10-CM

## 2021-01-16 DIAGNOSIS — I10 Essential (primary) hypertension: Secondary | ICD-10-CM

## 2021-01-16 DIAGNOSIS — Z7689 Persons encountering health services in other specified circumstances: Secondary | ICD-10-CM

## 2021-01-16 DIAGNOSIS — F03918 Unspecified dementia, unspecified severity, with other behavioral disturbance: Secondary | ICD-10-CM

## 2021-01-16 DIAGNOSIS — E785 Hyperlipidemia, unspecified: Secondary | ICD-10-CM

## 2021-01-16 NOTE — Patient Instructions (Signed)
-   Check weight daily at home Notify provider for any abrupt weight gain  - low salt diet

## 2021-01-16 NOTE — Progress Notes (Signed)
Provider: Marlowe Sax FNP-C   Jarrel Knoke, Nelda Bucks, NP  Patient Care Team: Iya Hamed, Nelda Bucks, NP as PCP - General (Family Medicine)  Extended Emergency Contact Information Primary Emergency Contact: [place] Mobile Phone: [phone] Relation: Daughter  Code Status:  Full Code  Goals of care: Advanced Directive information Advanced Directives 01/16/2021  Does Patient Have a Medical Advance Directive? Yes  Type of Advance Directive Living will  Does patient want to make changes to medical advance directive? No - Patient declined     Chief Complaint  Patient presents with   Establish Care    New Patient.    Concern     Moderate Fall Risk.    HPI:  Pt is a 81 y.o. female seen today to establish Care here at [facility] for medical management of chronic diseases. She resides in [place] following up with PCP at [place] with [employer].she was here visiting the son and Daughter in law since October,2022 when they noticed memory loss and confusion.she was evaluated in the ED 12/07/2020.Her lab work.X-ray were normal.MRI showed atrophy of the cerebellar volume.  Has a medical history of Hypertension,Afib,Hyperlipidemia,Congestive Heart Failure,Dementia without behavioral disturbance,Heart Murmur,Osteoarthritis of the Knees,depression,Former smoker Quit in 1970 among others. She belongs to [employer] states no blood transfusion. Follows up with Neurologist ? Lewy Body Dementia.Has an MRI scheduled for 01/22/2021 Also has follow up appointment with Cardiologist  01/20/2021 for possible congestive Heart Failure referred by Neuro.  Has    Past Medical History:  Diagnosis Date   History of mammogram 2021   Hypertension    History reviewed. No pertinent surgical history.  Allergies  Allergen Reactions   Shellfish Allergy Hives    Allergies as of 01/16/2021       Reactions   Shellfish Allergy Hives         Medication List        Accurate as of January 16, 2021  2:05 PM. If you have any questions, ask your nurse or doctor.          STOP taking these medications    acetaminophen 500 MG tablet Commonly known as: TYLENOL Stopped by: Sandrea Hughs, NP   baclofen 10 MG tablet Commonly known as: LIORESAL Stopped by: Sandrea Hughs, NP   ferrous sulfate 325 (65 FE) MG tablet Stopped by: Nelda Bucks Kade Demicco, NP       TAKE these medications    Acetaminophen-Codeine 300-30 MG tablet Take 1 tablet by mouth as needed.   diazepam 5 MG tablet Commonly known as: Valium Take 1 tab (5 mg) 30 mins prior tot the MRI. May take another right prior to the procedure if the first dose not therapeutic   diclofenac Sodium 1 % Gel Commonly known as: VOLTAREN Apply 2 g topically 4 (four) times daily.   Eliquis 5 MG Tabs tablet Generic drug: apixaban Take 5 mg by mouth 2 (two) times daily.   losartan 100 MG tablet Commonly known as: COZAAR Take 100 mg by mouth daily.   metolazone 2.5 MG tablet Commonly known as: ZAROXOLYN Take 2.5 mg by mouth once a week.   rosuvastatin 10 MG tablet Commonly known as: CRESTOR Take by mouth.   spironolactone-hydrochlorothiazide 25-25 MG tablet Commonly known as: ALDACTAZIDE Take 1 tablet by mouth daily.   Vitamin D3 25 MCG (1000 UT) Caps Take by mouth daily.        Review of Systems  Constitutional:  Negative for  appetite change, chills, fatigue, fever and unexpected weight change.  HENT:  Negative for congestion, dental problem, ear discharge, ear pain, facial swelling, hearing loss, nosebleeds, postnasal drip, rhinorrhea, sinus pressure, sinus pain, sneezing, sore throat, tinnitus and trouble swallowing.   Eyes:  Negative for pain, discharge, redness, itching and visual disturbance.  Respiratory:  Negative for cough, chest tightness, shortness of breath and wheezing.   Cardiovascular:  Positive for leg swelling. Negative for chest pain  and palpitations.  Gastrointestinal:  Negative for abdominal distention, abdominal pain, blood in stool, constipation, diarrhea, nausea and vomiting.  Endocrine: Negative for cold intolerance, heat intolerance, polydipsia, polyphagia and polyuria.  Genitourinary:  Negative for difficulty urinating, dysuria, flank pain, frequency and urgency.  Musculoskeletal:  Positive for arthralgias and gait problem. Negative for back pain, joint swelling, myalgias, neck pain and neck stiffness.       [place] in November tail bone still hurts   Skin:  Negative for color change, pallor, rash and wound.  Neurological:  Negative for dizziness, syncope, speech difficulty, weakness, light-headedness, numbness and headaches.  Hematological:  Does not bruise/bleed easily.  Psychiatric/Behavioral:  Positive for confusion and sleep disturbance. Negative for agitation, behavioral problems, hallucinations, self-injury and suicidal ideas. The patient is nervous/anxious.        Memory lapse     There is no immunization history on file for this patient. Pertinent  Health Maintenance Due  Topic Date Due   DEXA SCAN  Never done   INFLUENZA VACCINE  Never done   Fall Risk 12/07/2020 12/13/2020 01/16/2021  Falls in the past year? - 1 1  Was there an injury with Fall? - 1 1  Fall Risk Category Calculator - 3 2  Fall Risk Category - High Moderate  Patient Fall Risk Level High fall risk High fall risk Moderate fall risk  Patient at Risk for Falls Due to - - History of fall(s)  Fall risk Follow up - - Falls evaluation completed;Education provided;Falls prevention discussed   Functional Status Survey:    Vitals:   01/16/21 1328  BP: 110/70  Pulse: (!) 57  Resp: 16  Temp: (!) 97.3 F (36.3 C)  SpO2: 94%  Weight: 133 lb 6.4 oz (60.5 kg)  Height: 5' (1.524 m)   Body mass index is 26.05 kg/m. Physical Exam Vitals reviewed.  Constitutional:      General: She is not in acute distress.    Appearance: Normal appearance.  She is normal weight. She is not ill-appearing or diaphoretic.  HENT:     Head: Normocephalic.     Right Ear: Tympanic membrane, ear canal and external ear normal. There is no impacted cerumen.     Left Ear: Tympanic membrane, ear canal and external ear normal. There is no impacted cerumen.     Nose: Nose normal. No congestion or rhinorrhea.     Mouth/Throat:     Mouth: Mucous membranes are moist.     Pharynx: Oropharynx is clear. No oropharyngeal exudate or posterior oropharyngeal erythema.  Eyes:     General: No scleral icterus.       Right eye: No discharge.        Left eye: No discharge.     Extraocular Movements: Extraocular movements intact.     Conjunctiva/sclera: Conjunctivae normal.     Pupils: Pupils are equal, round, and reactive to light.  Neck:     Vascular: No carotid bruit.  Cardiovascular:     Rate and Rhythm: Normal rate and regular rhythm.  Pulses: Normal pulses.     Heart sounds: Normal heart sounds. No murmur heard.   No friction rub. No gallop.  Pulmonary:     Effort: Pulmonary effort is normal. No respiratory distress.     Breath sounds: Normal breath sounds. No wheezing, rhonchi or rales.  Chest:     Chest wall: No tenderness.  Abdominal:     General: Bowel sounds are normal. There is no distension.     Palpations: Abdomen is soft. There is no mass.     Tenderness: There is no abdominal tenderness. There is no right CVA tenderness, left CVA tenderness, guarding or rebound.  Musculoskeletal:        General: No swelling or tenderness. Normal range of motion.     Cervical back: Normal range of motion. No rigidity or tenderness.     Right lower leg: No edema.     Left lower leg: No edema.  Lymphadenopathy:     Cervical: No cervical adenopathy.  Skin:    General: Skin is warm and dry.     Coloration: Skin is not pale.     Findings: No bruising, erythema, lesion or rash.  Neurological:     Mental Status: She is alert.     Cranial Nerves: No cranial  nerve deficit.     Sensory: No sensory deficit.     Motor: No weakness.     Coordination: Coordination normal.     Gait: Gait normal.     Comments: Disoriented to place scored 19/30 on MMSE with Neuro   Psychiatric:        Mood and Affect: Mood normal.        Speech: Speech normal.        Behavior: Behavior normal.        Thought Content: Thought content normal.        Cognition and Memory: Cognition is impaired. Memory is impaired.        Judgment: Judgment normal.    Labs reviewed: Recent Labs    12/07/20 1248  NA 132*  K 3.4*  CL 98  CO2 25  GLUCOSE 107*  BUN 24*  CREATININE 1.14*  CALCIUM 9.5   Recent Labs    12/07/20 1248  AST 20  ALT 11  ALKPHOS 41  BILITOT 0.4  PROT 7.4  ALBUMIN 3.7   Recent Labs    12/07/20 1248  WBC 3.1*  HGB 11.5*  HCT 34.3*  MCV 93.5  PLT 169   Lab Results  Component Value Date   TSH 1.30 12/27/2020   No results found for: HGBA1C No results found for: CHOL, HDL, LDLCALC, LDLDIRECT, TRIG, CHOLHDL  Significant Diagnostic Results in last 30 days:  No results found.  Assessment/Plan  1. Encounter to establish care Resides in [place] was here visiting son and daughter in law in October,2022  when they noticed memory loss and confusion.will need to obtain medical records from previous PCP to update immunization.Not sure whether she will return to her home by Daughter in law states probable not due to memory issues unable to leave by herself so might stay here or move in with another son in [place].  - Recommended Fasting labs work   2. Chronic atrial fibrillation (HCC) HR controlled  Continue on Eliquis  Continue to follow up with Cardiologist  No signs of bleeding reported  - CBC with Differential/Platelet; Future - CMP with eGFR([facility]); Future  3. Chronic systolic congestive heart failure (HCC) No signs of fluid  overload  - continue on Metolazone and Aldactazide  - Keep legs elevated when seated to keep  swelling down  - check weight at least three times per week and notify provider for any abrupt weight gain > 3 lbs  - Reduce salt intake in diet  - continue on diuretics   4. Primary hypertension B/p well controlled  - continue on losartan and Aldactazide  - continue rosuvastatin  - CBC with Differential/Platelet; Future - CMP with eGFR([facility]); Future  5. Hyperlipidemia LDL goal <100 No latest LDL for review  - continue rosuvastatin  - Lipid panel; Future  6. Dementia with behavioral disturbance New onset memory loss and confusion noted by son and daughter in law  - Has upcoming MRI 01/22/2021  - continue to follow up with Neurologist - supportive care   Family/ staff Communication: Reviewed plan of care with patient and daughter in law verbalized understanding   Labs/tests ordered:  - CBC with Differential/Platelet - CMP with eGFR([facility]) - TSH - Lipid panel  Next Appointment : 4 months for medical management of chronic issues.Fasting Labs prior to visit.    Sandrea Hughs, NP

## 2021-01-17 NOTE — Progress Notes (Signed)
Cardiology Office Note:   Date:  01/20/2021  NAME:  Carla Gregory    MRN: [id number] DOB:  27-Jun-1940   PCP:  Caesar Bookman, NP  Cardiologist:  None  Electrophysiologist:  None   Referring MD: Marcos Eke, PA-C   Chief Complaint  Patient presents with   Edema    History of Present Illness:   Carla Gregory is a 81 y.o. female with a hx of HTN, dementia who is being seen today for the evaluation of LE edema at the request of Elwyn Reach.  She presents with her son and daughter-in-law.  Apparently she came to visit several weeks ago.  They noticed memory impairment and confusion.  She was taken to the emergency room on 12/07/2020 for confusion.  Laboratory data were really unremarkable.  BMP without electrolyte derangement.  High-sensitivity troponins were negative.  Chest x-ray was negative.  Mention of mild cardiomegaly.  She was then evaluated by neurology.  Brain MRI shows atrophy of the cerebellar volume.  Recent testing from primary care physician shows normal thyroid studies.  Her evaluation by neurology was concerning for possible congestive heart failure.  She had lower extremity edema which she always has.  She informs me that she was seeing a cardiologist in [place].  She was seeing Dr. Lahoma Rocker at [facility].  We do not have records but we will reach out and get them.  Her son has concerns for her being on a blood thinner.  She is on Eliquis 5 mg twice daily.  Her EKG today shows sinus bradycardia with no evidence of ischemia or infarction.  It is unclear why she is on Eliquis.  She does not remember.  Her blood pressure is also well controlled 108/64.  She does report some dizziness and lightheadedness upon standing.  We discussed reducing the dose.  She is on metolazone 2.5 mg once weekly.  She is also on Aldactone and HCTZ.  She has never had a heart attack or stroke.  She is a never smoker.  She does not  drink alcohol or use drugs.  She is widowed.  She has 7 children.  All boys.  She is visiting the son in [place] and has not returned home due to their concern for memory loss.  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  Problem List Dementia  HTN  Past Medical History: Past Medical History:  Diagnosis Date   History of mammogram 2021   Hypertension     Past Surgical History: History reviewed. No pertinent surgical history.  Current Medications: Current Meds  Medication Sig   Acetaminophen-Codeine 300-30 MG tablet Take 1 tablet by mouth as needed.   Cholecalciferol (VITAMIN D3) 25 MCG (1000 UT) CAPS Take by mouth daily.   diazepam (VALIUM) 5 MG tablet Take 1 tab (5 mg) 30 mins prior tot the MRI. May take another right prior to the procedure if the first dose not therapeutic   diclofenac Sodium (VOLTAREN) 1 % GEL Apply 2 g topically 4 (four) times daily.   ELIQUIS 5 MG TABS tablet Take 5 mg by mouth 2 (two) times daily.   metolazone (ZAROXOLYN) 2.5 MG tablet Take 2.5 mg by mouth once a week.   rosuvastatin (CRESTOR) 10 MG tablet Take by mouth.   spironolactone-hydrochlorothiazide (ALDACTAZIDE) 25-25 MG tablet Take 1 tablet by mouth daily.   [DISCONTINUED] losartan (COZAAR) 100 MG tablet Take 100 mg by mouth  daily.     Allergies:    Shellfish allergy   Social History: Social History   Socioeconomic History   Marital status: Widowed    Spouse name: Not on file   Number of children: 7   Years of education: Not on file   Highest education level: Not on file  Occupational History   Occupation: Retired  Tobacco Use   Smoking status: Former    Types: Cigarettes   Smokeless tobacco: Never  Building services engineerVaping Use   Vaping Use: Never used  Substance and Sexual Activity   Alcohol use: Never   Drug use: Never   Sexual activity: Not on file  Other Topics Concern   Not on file  Social History Narrative   Diet:      Caffeine: coffee      Married, if yes  what year: widowed      Do you live in a house, apartment, assisted living, condo, trailer, ect: house      Is it one or more stories: one      How many persons live in your home? one      Pets: no      Highest level or education completed: 10 grade      Current/Past profession: none      Exercise: no                Type and how often:          Living Will: Yes   DNR:   POA/HPOA:      Functional Status:   Do you have difficulty bathing or dressing yourself?   Do you have difficulty preparing food or eating? Yes   Do you have difficulty managing your medications? Yes   Do you have difficulty managing your finances? No   Do you have difficulty affording your medications? Yes   Social Determinants of Health   Financial Resource Strain: Not on file  Food Insecurity: Not on file  Transportation Needs: Not on file  Physical Activity: Not on file  Stress: Not on file  Social Connections: Not on file     Family History: The patient's family history includes Cancer in her father and mother; Cancer (age of onset: 2166) in her brother; Cancer (age of onset: 5571) in her sister; Clotting disorder (age of onset: 2847) in her daughter; Hypertension in her mother.  ROS:   All other ROS reviewed and negative. Pertinent positives noted in the HPI.     EKGs/Labs/Other Studies Reviewed:   The following studies were personally reviewed by me today:  EKG:  EKG is ordered today.  The ekg ordered today demonstrates sinus bradycardia heart rate 53, no acute ischemic changes or evidence of infarction, and was personally reviewed by me.   Recent Labs: 12/07/2020: ALT 11; BUN 24; Creatinine, Ser 1.14; Hemoglobin 11.5; Platelets 169; Potassium 3.4; Sodium 132 12/27/2020: TSH 1.30   Recent Lipid Panel No results found for: CHOL, TRIG, HDL, CHOLHDL, VLDL, LDLCALC, LDLDIRECT  Physical Exam:   VS:  BP 108/64    Pulse (!) 53    Ht 5\' 1"  (1.549 m)    Wt 131 lb 9.6 oz (59.7 kg)    SpO2 99%    BMI  24.87 kg/m    Wt Readings from Last 3 Encounters:  01/20/21 131 lb 9.6 oz (59.7 kg)  01/16/21 133 lb 6.4 oz (60.5 kg)  12/13/20 123 lb (55.8 kg)    General: Well nourished, well developed, in no acute  distress Head: Atraumatic, normal size  Eyes: PEERLA, EOMI  Neck: Supple, no JVD Endocrine: No thryomegaly Cardiac: Normal S1, S2; RRR; no murmurs, rubs, or gallops Lungs: Clear to auscultation bilaterally, no wheezing, rhonchi or rales  Abd: Soft, nontender, no hepatomegaly  Ext: No edema, pulses 2+ Musculoskeletal: No deformities, BUE and BLE strength normal and equal Skin: Warm and dry, no rashes   Neuro: Alert and oriented to person, place, time, and situation, CNII-XII grossly intact, no focal deficits  Psych: Normal mood and affect   ASSESSMENT:   Tyrene Nader is a 81 y.o. female who presents for the following: 1. Leg edema   2. Primary hypertension     PLAN:   1. Leg edema -She has evidence of venous insufficiency on exam.  No signs of congestive heart failure.  She reports no shortness of breath.  She is not active but really has no symptoms of this.  Her chest x-ray was clear.  She was followed by cardiologist in [place].  We will reach out and obtain the records.  For now would recommend to continue her home diuretic as prescribed by her cardiologist.  She should also continue with leg elevation.  She is wearing compression stockings which will help. -She has no elevated JVD to suggest congestive heart failure.  EKG is normal. -Regarding her Eliquis I do not know why she is on this.  She is in normal sinus rhythm.  We will reach out to her cardiologist to obtain records to determine the reason for this. -From a cardiovascular standpoint she seems stable.  I would like to reduce her losartan given her symptoms of dizziness.  I suspect her blood pressure is a bit too low.  All other medications appear to be stable.  2. Primary hypertension -Would  reduce losartan 50 mg daily.  She is on Aldactone 25 mg daily and HCTZ 25 mg daily.  She takes Zaroxolyn 2.5 mg once a week.  We will reach out and obtain her records from her primary cardiologist in [place].  I suspect she can stop metolazone.  It is unclear if she will return to [place].  If she does she can follow-up with her cardiologist there.  If she remains in [place] we will continue to see her.  Again we will know more once we have records from her cardiologist.  Disposition: Return if symptoms worsen or fail to improve.  Medication Adjustments/Labs and Tests Ordered: Current medicines are reviewed at length with the patient today.  Concerns regarding medicines are outlined above.  Orders Placed This Encounter  Procedures   EKG 12-Lead   EKG 12-Lead   Meds ordered this encounter  Medications   losartan (COZAAR) 50 MG tablet    Sig: Take 1 tablet (50 mg total) by mouth daily.    Dispense:  90 tablet    Refill:  1    Patient Instructions  Medication Instructions:  Decrease Losartan to 50 mg daily   *If you need a refill on your cardiac medications before your next appointment, please call your pharmacy*   Follow-Up: At [facility], you and your health needs are our priority.  As part of our continuing mission to provide you with exceptional heart care, we have created designated Provider Care Teams.  These Care Teams include your primary Cardiologist (physician) and Advanced Practice Providers (APPs -  Physician Assistants and Nurse Practitioners) who all work together to provide you with the care  you need, when you need it.  We recommend signing up for the patient portal called "MyChart".  Sign up information is provided on this After Visit Summary.  MyChart is used to connect with patients for Virtual Visits (Telemedicine).  Patients are able to view lab/test results, encounter notes, upcoming appointments, etc.  Non-urgent messages can be sent  to your provider as well.   To learn more about what you can do with MyChart, go to [email or web address].    Your next appointment:   As needed  The format for your next appointment:   In Person  Provider:   Lennie OdorWesley O'Neal, MD     Signed, Lenna GilfordWesley T. Flora Lipps'Neal, MD, [facility] [place]   [facility]  [address], [address] [place], [place] [address] [phone]  01/20/2021 4:44 PM

## 2021-01-18 ENCOUNTER — Other Ambulatory Visit: Payer: Self-pay

## 2021-01-18 ENCOUNTER — Ambulatory Visit
Admission: RE | Admit: 2021-01-18 | Discharge: 2021-01-18 | Disposition: A | Discharge status: Home / Self Care | Payer: Medicare Other | Source: Ambulatory Visit | Attending: Physician Assistant | Admitting: Physician Assistant

## 2021-01-18 IMAGING — MR MR HEAD W/O CM
12 series · 48 of 48 positions shown · non-contrast
Comparison: Head CT [DATE].

CLINICAL DATA: 80-year-old female with confusion and memory loss.
Fall 6 months ago.

EXAM:
MRI HEAD WITHOUT CONTRAST
TECHNIQUE: Multiplanar, multiecho pulse sequences of the brain and surrounding
structures were obtained without intravenous contrast.

[Series 5: T1 · sagittal · 5.0mm · 0.45mm/px · 2 of 28 slices shown]
[im 1/28]
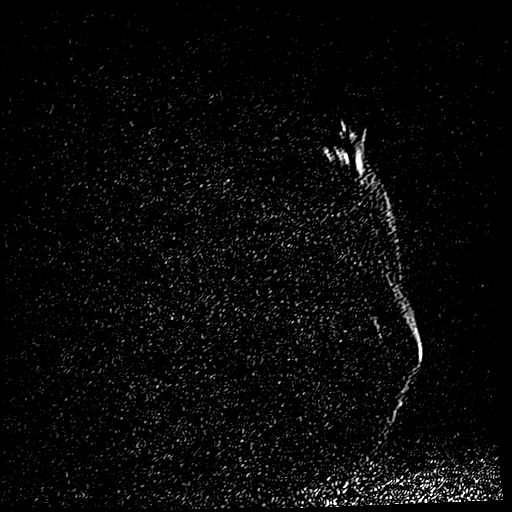
[im 28/28]
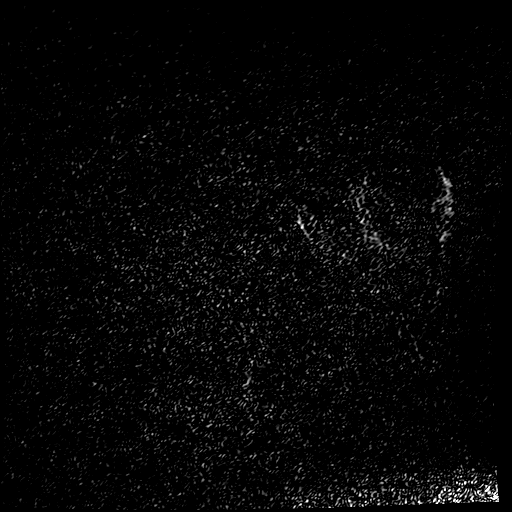

[Series 6: ax ep2d_diff_3 · axial · 3.0mm · 1.80mm/px · z∈[-19,+150]mm · 7 of 102 slices shown]
[im 1/102]
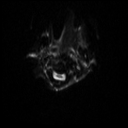
[im 17/102]
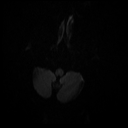
[im 34/102]
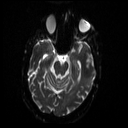
[im 51/102]
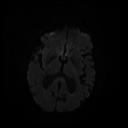
[im 68/102]
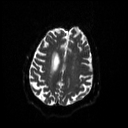
[im 85/102]
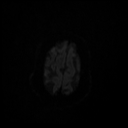
[im 102/102]
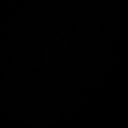

[Series 7: ax ep2d_diff_3_adc · axial · 3.0mm · 1.80mm/px · z∈[-19,+150]mm · 4 of 58 slices shown]
[im 1/58]
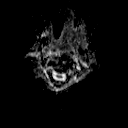
[im 20/58]
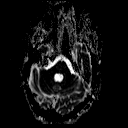
[im 39/58]
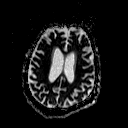
[im 58/58]
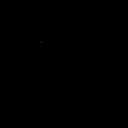

[Series 8: cor ep2d_diff · coronal · 5.0mm · 1.77mm/px · 4 of 61 slices shown]
[im 1/61]
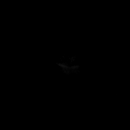
[im 21/61]
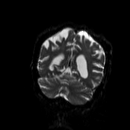
[im 41/61]
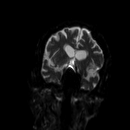
[im 61/61]
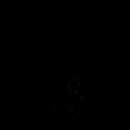

[Series 9: cor ep2d_diff_adc · coronal · 5.0mm · 1.77mm/px · 2 of 31 slices shown]
[im 1/31]
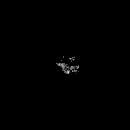
[im 31/31]
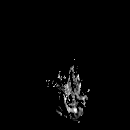

[Series 11: swi_images · axial · 2.0mm · 0.98mm/px · z∈[-31,+142]mm · 6 of 88 slices shown]
[im 1/88]
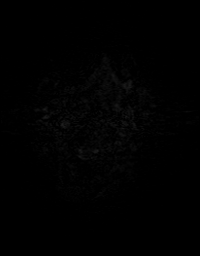
[im 18/88]
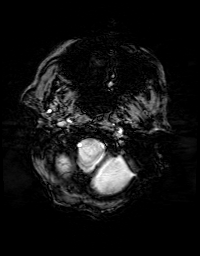
[im 35/88]
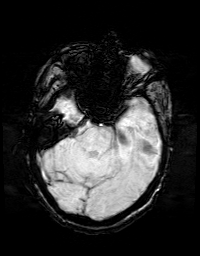
[im 53/88]
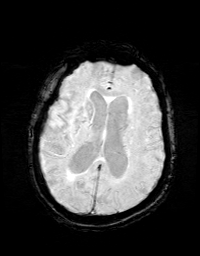
[im 70/88]
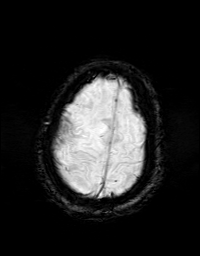
[im 88/88]
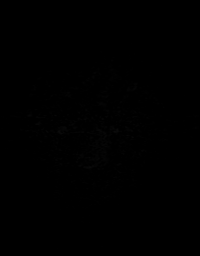

[Series 12: FLAIR · axial · 3.0mm · 0.43mm/px · z∈[-29,+137]mm · 3 of 44 slices shown (1 of 2)]
[im 1/44]
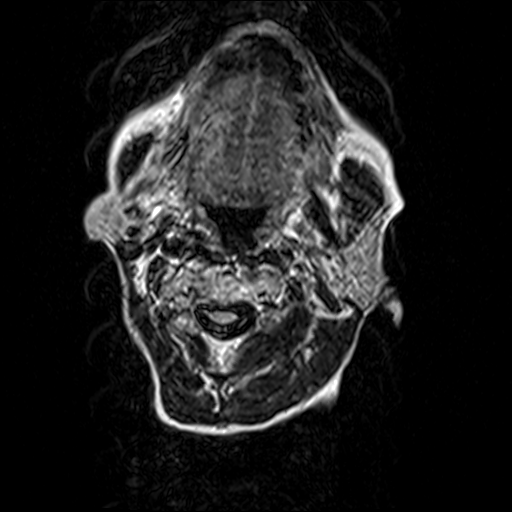
[im 22/44]
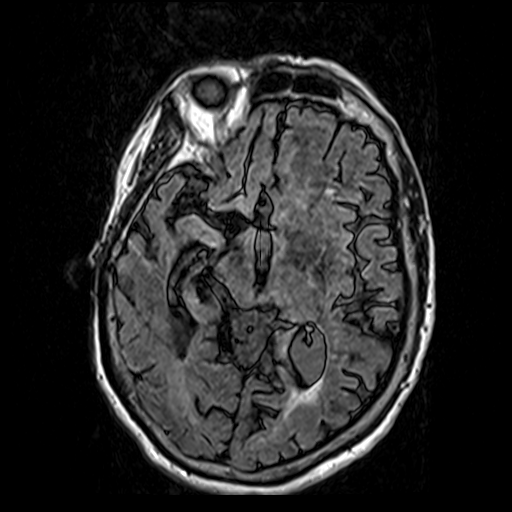
[im 44/44]
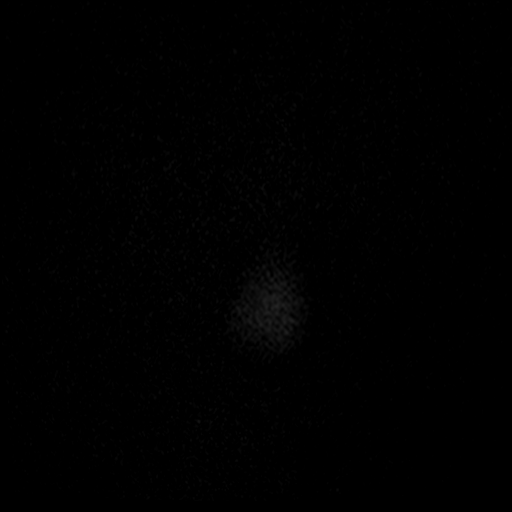

[Series 13: T2 · axial · 5.0mm · 0.65mm/px · z∈[-27,+140]mm · 2 of 29 slices shown (1 of 3)]
[im 1/29]
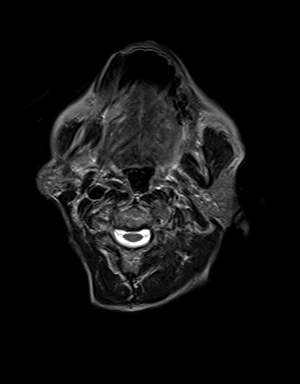
[im 29/29]
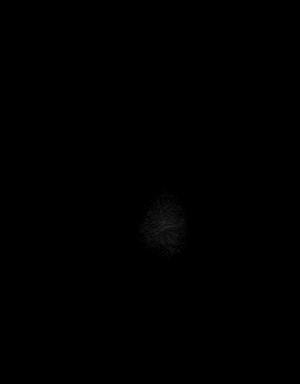

[Series 14: t1_mpr_(person_name) · axial · 1.0mm · 0.72mm/px · z∈[-15,+142]mm · 11 of 160 slices shown]
[im 1/160]
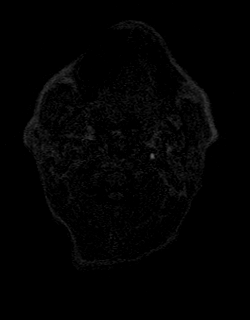
[im 16/160]
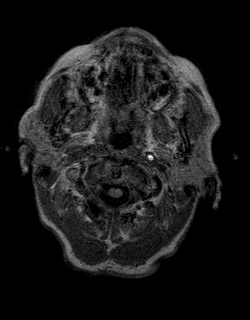
[im 32/160]
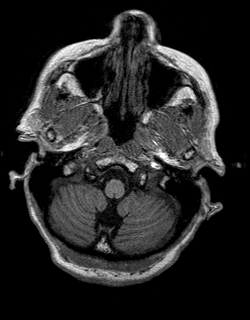
[im 48/160]
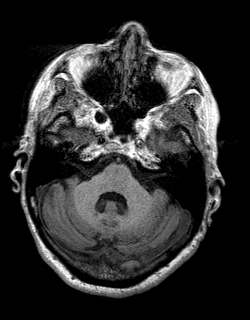
[im 64/160]
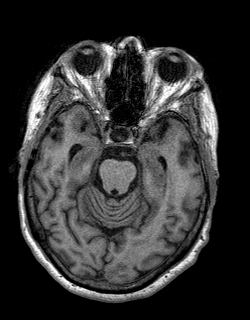
[im 80/160]
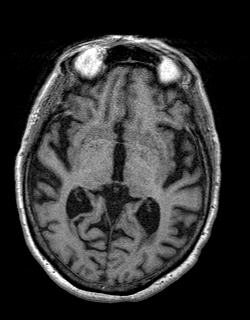
[im 96/160]
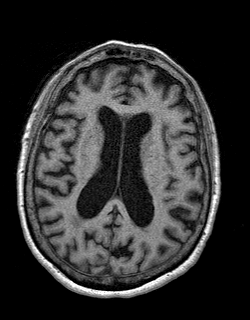
[im 112/160]
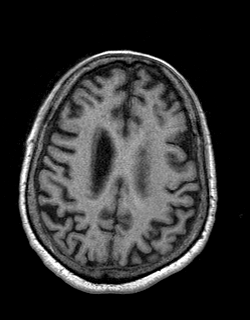
[im 128/160]
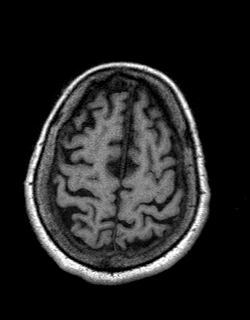
[im 144/160]
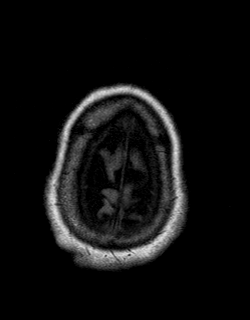
[im 160/160]
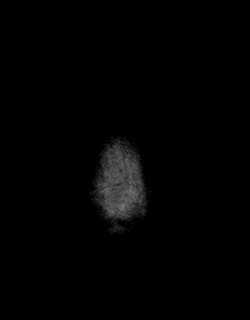

[Series 15: T2 · coronal · 5.0mm · 0.43mm/px · 2 of 33 slices shown (2 of 3)]
[im 1/33]
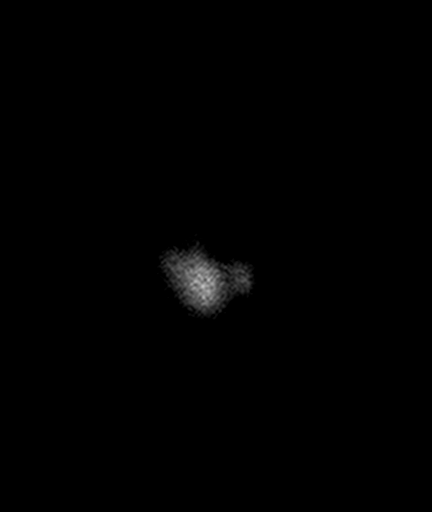
[im 33/33]
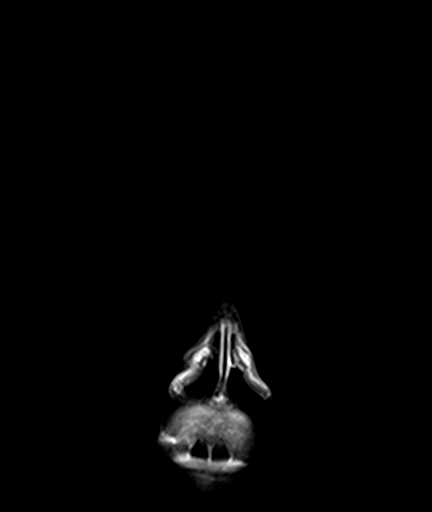

[Series 16: FLAIR · axial · 3.0mm · 0.43mm/px · z∈[-29,+138]mm · 3 of 44 slices shown (2 of 2)]
[im 1/44]
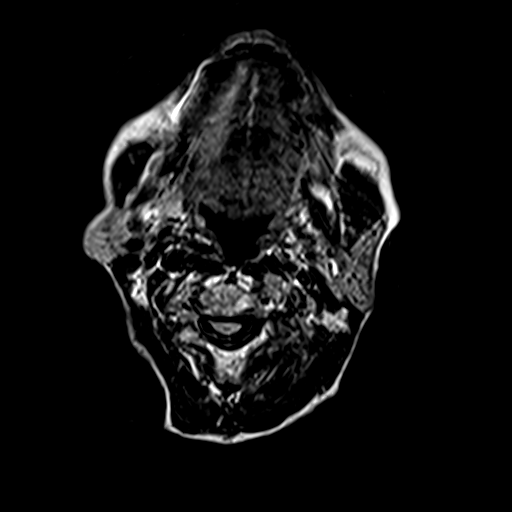
[im 22/44]
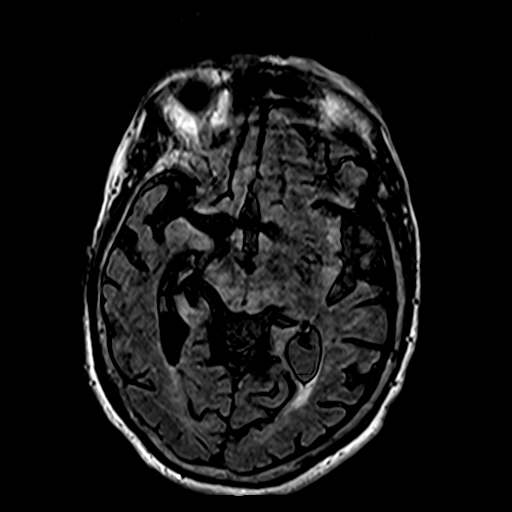
[im 44/44]
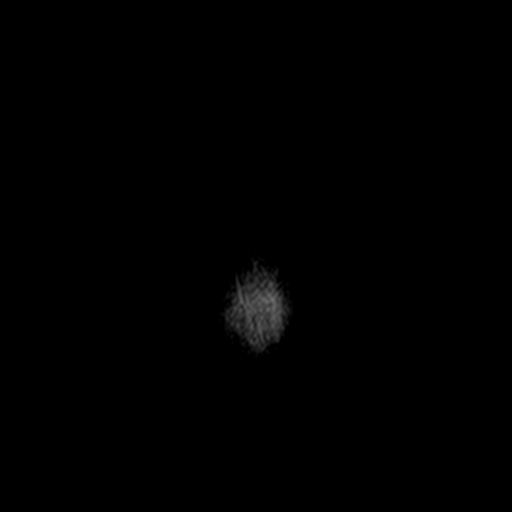

[Series 17: T2 · axial · 5.0mm · 0.65mm/px · z∈[-27,+140]mm · 2 of 29 slices shown (3 of 3)]
[im 1/29]
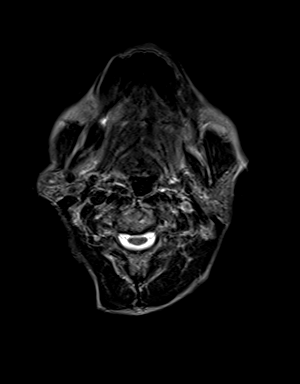
[im 29/29]
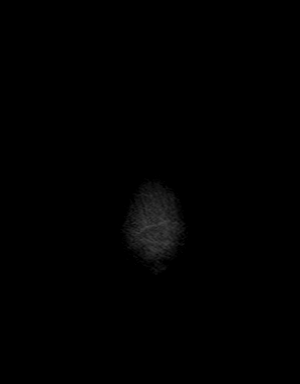

[48 of 48 positions shown; findings below may reference images not displayed]

FINDINGS: Study is intermittently degraded by motion artifact despite repeated
imaging attempts.

Brain: No restricted diffusion to suggest acute infarction. No
midline shift, mass effect, evidence of mass lesion,
ventriculomegaly, extra-axial collection or acute intracranial
hemorrhage. Cervicomedullary junction and pituitary are within
normal limits.

Stable cerebral volume since last year. Volume loss appears
generalized. No disproportionate areas of cerebral atrophy
identified.

Patchy fairly mild for age nonspecific cerebral white matter T2 and
FLAIR hyperintensity, mostly periventricular. No cortical
encephalomalacia or chronic cerebral blood products identified. Deep
gray matter nuclei, brainstem and cerebellum are within normal
limits for age.

Vascular: Major intracranial vascular flow voids are preserved.

Skull and upper cervical spine: Advanced cervical spine degeneration
visible on series 5, image 14 with up to moderate C3-C4 degenerative
spinal stenosis, mild at C2-C3. Visualized bone marrow signal is
within normal limits.

Sinuses/Orbits: Negative. Paranasal sinuses and mastoids are stable
and well aerated.

Other: Visible internal auditory structures appear normal. Visible
scalp and face appear negative.
IMPRESSION: 1. No acute intracranial abnormality.
2. Generalized cerebral volume loss but no regions of
disproportionate atrophy identified. Mild for age cerebral white
matter signal changes.
3. Partially visible advanced cervical spine degeneration, including
moderate degenerative spinal stenosis at C3-C4.

## 2021-01-20 ENCOUNTER — Other Ambulatory Visit: Payer: Self-pay

## 2021-01-20 ENCOUNTER — Encounter: Payer: Self-pay | Admitting: Cardiovascular Disease

## 2021-01-20 ENCOUNTER — Ambulatory Visit (INDEPENDENT_AMBULATORY_CARE_PROVIDER_SITE_OTHER): Payer: Medicare Other | Admitting: Cardiovascular Disease

## 2021-01-20 VITALS — BP 108/64 | HR 53 | Ht 61.0 in | Wt 131.6 lb

## 2021-01-20 DIAGNOSIS — I10 Essential (primary) hypertension: Secondary | ICD-10-CM | POA: Diagnosis not present

## 2021-01-20 DIAGNOSIS — R600 Localized edema: Secondary | ICD-10-CM

## 2021-01-20 DIAGNOSIS — I517 Cardiomegaly: Secondary | ICD-10-CM

## 2021-01-20 MED ORDER — LOSARTAN POTASSIUM 50 MG PO TABS: 50.0000 mg | ORAL_TABLET | Freq: Every day | ORAL | 1 refills | Status: DC

## 2021-01-20 NOTE — Patient Instructions (Signed)
Medication Instructions:  Decrease Losartan to 50 mg daily   *If you need a refill on your cardiac medications before your next appointment, please call your pharmacy*   Follow-Up: At [facility], you and your health needs are our priority.  As part of our continuing mission to provide you with exceptional heart care, we have created designated Provider Care Teams.  These Care Teams include your primary Cardiologist (physician) and Advanced Practice Providers (APPs -  Physician Assistants and Nurse Practitioners) who all work together to provide you with the care you need, when you need it.  We recommend signing up for the patient portal called "MyChart".  Sign up information is provided on this After Visit Summary.  MyChart is used to connect with patients for Virtual Visits (Telemedicine).  Patients are able to view lab/test results, encounter notes, upcoming appointments, etc.  Non-urgent messages can be sent to your provider as well.   To learn more about what you can do with MyChart, go to [email or web address].    Your next appointment:   As needed  The format for your next appointment:   In Person  Provider:   Lennie Odor, MD

## 2021-01-20 NOTE — Progress Notes (Signed)
Pt advised of Mri results.  

## 2021-01-22 ENCOUNTER — Other Ambulatory Visit: Payer: Self-pay

## 2021-01-22 ENCOUNTER — Other Ambulatory Visit: Payer: Medicare Other

## 2021-01-22 DIAGNOSIS — E785 Hyperlipidemia, unspecified: Secondary | ICD-10-CM

## 2021-01-22 DIAGNOSIS — I10 Essential (primary) hypertension: Secondary | ICD-10-CM

## 2021-01-22 DIAGNOSIS — I482 Chronic atrial fibrillation, unspecified: Secondary | ICD-10-CM

## 2021-01-22 LAB — CBC WITH DIFFERENTIAL/PLATELET
Absolute Monocytes: 310 cells/uL (ref 200–950)
Basophils Absolute: 19 cells/uL (ref 0–200)
Basophils Relative: 0.6 %
Eosinophils Absolute: 70 cells/uL (ref 15–500)
Eosinophils Relative: 2.2 %
HCT: 31.1 % — ABNORMAL LOW (ref 35.0–45.0)
Hemoglobin: 10.5 g/dL — ABNORMAL LOW (ref 11.7–15.5)
Lymphs Abs: 835 cells/uL — ABNORMAL LOW (ref 850–3900)
MCH: 31.9 pg (ref 27.0–33.0)
MCHC: 33.8 g/dL (ref 32.0–36.0)
MCV: 94.5 fL (ref 80.0–100.0)
MPV: 10.7 fL (ref 7.5–12.5)
Monocytes Relative: 9.7 %
Neutro Abs: 1965 cells/uL (ref 1500–7800)
Neutrophils Relative %: 61.4 %
Platelets: 227 10*3/uL (ref 140–400)
RBC: 3.29 10*6/uL — ABNORMAL LOW (ref 3.80–5.10)
RDW: 13.1 % (ref 11.0–15.0)
Total Lymphocyte: 26.1 %
WBC: 3.2 10*3/uL — ABNORMAL LOW (ref 3.8–10.8)

## 2021-01-22 LAB — COMPLETE METABOLIC PANEL WITH GFR
AG Ratio: 1.3 (calc) (ref 1.0–2.5)
ALT: 8 U/L (ref 6–29)
AST: 20 U/L (ref 10–35)
Albumin: 3.8 g/dL (ref 3.6–5.1)
Alkaline phosphatase (APISO): 49 U/L (ref 37–153)
BUN/Creatinine Ratio: 10 (calc) (ref 6–22)
BUN: 10 mg/dL (ref 7–25)
CO2: 29 mmol/L (ref 20–32)
Calcium: 9.7 mg/dL (ref 8.6–10.4)
Chloride: 94 mmol/L — ABNORMAL LOW (ref 98–110)
Creat: 0.96 mg/dL — ABNORMAL HIGH (ref 0.60–0.95)
Globulin: 3 g/dL (calc) (ref 1.9–3.7)
Glucose, Bld: 84 mg/dL (ref 65–99)
Potassium: 3.9 mmol/L (ref 3.5–5.3)
Sodium: 130 mmol/L — ABNORMAL LOW (ref 135–146)
Total Bilirubin: 0.6 mg/dL (ref 0.2–1.2)
Total Protein: 6.8 g/dL (ref 6.1–8.1)
eGFR: 59 mL/min/{1.73_m2} — ABNORMAL LOW (ref >=60)

## 2021-01-22 LAB — LIPID PANEL
Cholesterol: 264 mg/dL — ABNORMAL HIGH (ref <200)
HDL: 77 mg/dL (ref >=50)
LDL Cholesterol (Calc): 173 mg/dL (calc) — ABNORMAL HIGH
Non-HDL Cholesterol (Calc): 187 mg/dL (calc) — ABNORMAL HIGH (ref <130)
Total CHOL/HDL Ratio: 3.4 (calc) (ref <5.0)
Triglycerides: 47 mg/dL (ref <150)

## 2021-01-23 ENCOUNTER — Other Ambulatory Visit: Payer: Medicare Other

## 2021-01-29 ENCOUNTER — Other Ambulatory Visit: Payer: Self-pay

## 2021-01-29 DIAGNOSIS — I10 Essential (primary) hypertension: Secondary | ICD-10-CM

## 2021-01-29 DIAGNOSIS — E785 Hyperlipidemia, unspecified: Secondary | ICD-10-CM

## 2021-01-29 DIAGNOSIS — I5022 Chronic systolic (congestive) heart failure: Secondary | ICD-10-CM

## 2021-02-24 ENCOUNTER — Other Ambulatory Visit: Payer: Self-pay

## 2021-02-24 ENCOUNTER — Telehealth: Payer: Self-pay | Admitting: *Deleted

## 2021-02-24 ENCOUNTER — Encounter: Payer: Self-pay | Admitting: Family

## 2021-02-24 ENCOUNTER — Ambulatory Visit (INDEPENDENT_AMBULATORY_CARE_PROVIDER_SITE_OTHER): Payer: Medicare Other | Admitting: Family

## 2021-02-24 VITALS — BP 134/70 | HR 66 | Temp 97.7°F | Resp 20 | Ht 61.0 in | Wt 126.6 lb

## 2021-02-24 DIAGNOSIS — R600 Localized edema: Secondary | ICD-10-CM

## 2021-02-24 DIAGNOSIS — K5901 Slow transit constipation: Secondary | ICD-10-CM

## 2021-02-24 MED ORDER — DOCUSATE SODIUM 100 MG PO CAPS: 100.0000 mg | ORAL_CAPSULE | Freq: Every day | ORAL | 0 refills | Status: AC

## 2021-02-24 NOTE — Telephone Encounter (Signed)
Patient daughter [name] called and stated that they had an appointment today with Dinah and they are at the [employer] store right now trying to get Compression stocking and want to know if they need to get the 20/15 or Lightweight.   I messaged Dinah and she stated to get the 20/15.   Daughter notified and agreed.

## 2021-02-24 NOTE — Patient Instructions (Addendum)
-   wear knee high compression stockings on in the morning and off at bedtime for leg swelling   - check weight daily and notify provider for any abrupt weight gain > 3 lbs   - Notify provider for any cough,shortness of breath or wheezing   - Take Extra metolazone 2.5 mg tablet one by mouth today and wednesday and Saturday then resume regular weekly

## 2021-02-24 NOTE — Progress Notes (Signed)
Provider: Marlowe Sax FNP-C  Elliyah Liszewski, Nelda Bucks, NP  Patient Care Team: Kadence Mimbs, Nelda Bucks, NP as PCP - General (Family Medicine)  Extended Emergency Contact Information Primary Emergency Contact: [place] Mobile Phone: [phone] Relation: Daughter  Code Status:  Full code  Goals of care: Advanced Directive information Advanced Directives 01/16/2021  Does Patient Have a Medical Advance Directive? Yes  Type of Advance Directive Living will  Does patient want to make changes to medical advance directive? No - Patient declined     Chief Complaint  Patient presents with   Acute Visit    Patient presents today for bilateral leg swelling for a few days now. She reports legs are warm to touch but denies any pain at the moment.She reports constipation since December,2022 and have some normal bowel movements du to the use of suppositories.     HPI:  Pt is a 81 y.o. female seen today for an acute visit for evaluation of leg swelling.she is here with daughter in law. States has noticed both legs have been swollen and painful when pressed.has been taking her diuretics but not wearing her stockings.states need to get new stockings.She denies any cough,fatigue,,chest tightness,chest pain,palpitation,wheezing or shortness of breath.  Also complains of constipation.Last bowel movement was yesterday.Stool was hard.takes miralax whenever she does not go.Miralax has been effective. Daughter in law states patient eats small meals most time twice daily   Past Medical History:  Diagnosis Date   History of mammogram 2021   Hypertension    History reviewed. No pertinent surgical history.  Allergies  Allergen Reactions   Shellfish Allergy Hives    Outpatient Encounter Medications as of 02/24/2021  Medication Sig   Acetaminophen-Codeine 300-30 MG tablet Take 1 tablet by mouth as needed.   Cholecalciferol (VITAMIN D3) 25 MCG (1000 UT) CAPS Take by mouth daily.   diazepam (VALIUM) 5 MG  tablet Take 1 tab (5 mg) 30 mins prior tot the MRI. May take another right prior to the procedure if the first dose not therapeutic   diclofenac Sodium (VOLTAREN) 1 % GEL Apply 2 g topically 4 (four) times daily.   docusate sodium (COLACE) 100 MG capsule Take 1 capsule (100 mg total) by mouth daily.   ELIQUIS 5 MG TABS tablet Take 5 mg by mouth 2 (two) times daily.   losartan (COZAAR) 50 MG tablet Take 1 tablet (50 mg total) by mouth daily.   metolazone (ZAROXOLYN) 2.5 MG tablet Take 2.5 mg by mouth once a week.   rosuvastatin (CRESTOR) 10 MG tablet Take 20 mg by mouth.   spironolactone-hydrochlorothiazide (ALDACTAZIDE) 25-25 MG tablet Take 1 tablet by mouth daily.   vitamin C (ASCORBIC ACID) 500 MG tablet Take 500 mg by mouth daily.   No facility-administered encounter medications on file as of 02/24/2021.    Review of Systems Review of Systems  Constitutional:  Negative for chills, malaise/fatigue and weight loss.  Respiratory:  Negative for cough, shortness of breath and wheezing.   Cardiovascular:  Positive for leg swelling. Negative for chest pain, palpitations and orthopnea.  Gastrointestinal:  Positive for constipation. Negative for abdominal pain, nausea and vomiting.  Neurological:  Negative for dizziness and weakness.   Immunization History  Administered Date(s) Administered   Moderna Sars-Covid-2 Vaccination 02/24/2019, 03/24/2019, 11/15/2019   PFIZER(Purple Top)SARS-COV-2 Vaccination 05/18/2020, 10/16/2020   Pertinent  Health Maintenance Due  Topic Date Due   DEXA SCAN  Never done   INFLUENZA VACCINE  Never done   Fall Risk 12/07/2020 12/13/2020 01/16/2021  Falls in the past year? - 1 1  Was there an injury with Fall? - 1 1  Fall Risk Category Calculator - 3 2  Fall Risk Category - High Moderate  Patient Fall Risk Level High fall risk High fall risk Moderate fall risk  Patient at Risk for Falls Due to - - History of fall(s)  Fall risk Follow up - - Falls evaluation  completed;Education provided;Falls prevention discussed   Functional Status Survey:    Vitals:   02/24/21 0959  BP: 134/70  Pulse: 66  Resp: 20  Temp: 97.7 F (36.5 C)  SpO2: 97%  Weight: 126 lb 9.6 oz (57.4 kg)  Height: 5\' 1"  (1.549 m)   Body mass index is 23.92 kg/m.  Physical Exam Constitutional:      General: She is not in acute distress.    Appearance: She is not ill-appearing.  HENT:     Head: Normocephalic.     Mouth/Throat:     Mouth: Mucous membranes are moist.     Pharynx: Oropharynx is clear. No oropharyngeal exudate or posterior oropharyngeal erythema.  Eyes:     General: No scleral icterus.       Right eye: No discharge.        Left eye: No discharge.     Conjunctiva/sclera: Conjunctivae normal.     Pupils: Pupils are equal, round, and reactive to light.  Neck:     Vascular: No carotid bruit.  Cardiovascular:     Rate and Rhythm: Normal rate and regular rhythm.     Pulses: Normal pulses.     Heart sounds: Normal heart sounds. No murmur heard.   No friction rub. No gallop.  Pulmonary:     Effort: Pulmonary effort is normal. No respiratory distress.     Breath sounds: Normal breath sounds. No wheezing, rhonchi or rales.  Chest:     Chest wall: No tenderness.  Abdominal:     General: Bowel sounds are normal. There is no distension.     Palpations: Abdomen is soft. There is no mass.     Tenderness: There is no abdominal tenderness. There is no right CVA tenderness, guarding or rebound.  Musculoskeletal:        General: No tenderness.     Cervical back: Normal range of motion. No rigidity or tenderness.     Right lower leg: Edema present.     Left lower leg: Edema present.     Comments: Unsteady gait walks with a cane   Lymphadenopathy:     Cervical: No cervical adenopathy.  Skin:    General: Skin is warm and dry.     Coloration: Skin is not pale.     Findings: No bruising, erythema or rash.  Neurological:     Mental Status: She is alert. Mental  status is at baseline.     Sensory: No sensory deficit.     Motor: No weakness.     Gait: Gait abnormal.  Psychiatric:        Mood and Affect: Mood normal.        Behavior: Behavior normal.    Labs reviewed: Recent Labs    12/07/20 1248 01/22/21 0854  NA 132* 130*  K 3.4* 3.9  CL 98 94*  CO2 25 29  GLUCOSE 107* 84  BUN 24* 10  CREATININE 1.14* 0.96*  CALCIUM 9.5 9.7    Recent Labs    12/07/20 1248 01/22/21 0854  AST 20 20  ALT 11 8  ALKPHOS  41  --   BILITOT 0.4 0.6  PROT 7.4 6.8  ALBUMIN 3.7  --     Recent Labs    12/07/20 1248 01/22/21 0854  WBC 3.1* 3.2*  NEUTROABS  --  1,965  HGB 11.5* 10.5*  HCT 34.3* 31.1*  MCV 93.5 94.5  PLT 169 227    Lab Results  Component Value Date   TSH 1.30 12/27/2020   No results found for: HGBA1C Lab Results  Component Value Date   CHOL 264 (H) 01/22/2021   HDL 77 01/22/2021   LDLCALC 173 (H) 01/22/2021   TRIG 47 01/22/2021   CHOLHDL 3.4 01/22/2021    Significant Diagnostic Results in last 30 days:  No results found.  Assessment/Plan  1. Edema of both lower extremities No abrupt weight gain ,cough,wheezing or shortness of breath. Tends to sit for prolong period of time with legs down.Encouraged to keep legs elevated on recliner. - advised to wear knee high compression stockings on in the morning and off at bedtime. - Advised to Take Extra metolazone 2.5 mg tablet one by mouth today and wednesday and saturday then resume regular weekly  - check weight daily and notify provider for any abrupt weight gain > 3 lbs  - Notify provider for any cough,shortness of breath or wheezing  - Compression stockings  2. Slow transit constipation Take docusate daily and Miralax as needed  - encouraged increase fiber in diet  - docusate sodium (COLACE) 100 MG capsule; Take 1 capsule (100 mg total) by mouth daily.  Dispense: 10 capsule; Refill: 0  Family/ staff Communication: Reviewed plan of care with patient and daughter in  law  verbalized understanding   Labs/tests ordered: None   Next Appointment: As needed if symptoms worsen or fail to improve    Sandrea Hughs, NP

## 2021-03-03 ENCOUNTER — Other Ambulatory Visit: Payer: Self-pay

## 2021-03-03 ENCOUNTER — Other Ambulatory Visit: Payer: Medicare Other

## 2021-03-03 DIAGNOSIS — I5022 Chronic systolic (congestive) heart failure: Secondary | ICD-10-CM

## 2021-03-03 DIAGNOSIS — E785 Hyperlipidemia, unspecified: Secondary | ICD-10-CM

## 2021-03-03 DIAGNOSIS — I10 Essential (primary) hypertension: Secondary | ICD-10-CM

## 2021-03-03 LAB — LIPID PANEL
Cholesterol: 212 mg/dL — ABNORMAL HIGH (ref <200)
HDL: 69 mg/dL (ref >=50)
LDL Cholesterol (Calc): 133 mg/dL (calc) — ABNORMAL HIGH
Non-HDL Cholesterol (Calc): 143 mg/dL (calc) — ABNORMAL HIGH (ref <130)
Total CHOL/HDL Ratio: 3.1 (calc) (ref <5.0)
Triglycerides: 34 mg/dL (ref <150)

## 2021-03-03 LAB — BASIC METABOLIC PANEL WITH GFR
BUN: 14 mg/dL (ref 7–25)
CO2: 30 mmol/L (ref 20–32)
Calcium: 9.4 mg/dL (ref 8.6–10.4)
Chloride: 98 mmol/L (ref 98–110)
Creat: 0.9 mg/dL (ref 0.60–0.95)
Glucose, Bld: 75 mg/dL (ref 65–139)
Potassium: 4 mmol/L (ref 3.5–5.3)
Sodium: 135 mmol/L (ref 135–146)
eGFR: 64 mL/min/{1.73_m2} (ref >=60)

## 2021-05-08 ENCOUNTER — Other Ambulatory Visit: Payer: Self-pay | Admitting: Family

## 2021-05-08 DIAGNOSIS — I482 Chronic atrial fibrillation, unspecified: Secondary | ICD-10-CM

## 2021-05-08 DIAGNOSIS — I10 Essential (primary) hypertension: Secondary | ICD-10-CM

## 2021-05-08 DIAGNOSIS — E785 Hyperlipidemia, unspecified: Secondary | ICD-10-CM

## 2021-05-19 ENCOUNTER — Other Ambulatory Visit: Payer: Medicare Other

## 2021-05-21 ENCOUNTER — Encounter: Payer: Medicare Other | Admitting: Family

## 2021-05-25 NOTE — Progress Notes (Signed)
This encounter was created in error - please disregard.

## 2021-06-13 ENCOUNTER — Ambulatory Visit: Payer: Medicare Other | Admitting: Physician Assistant

## 2021-06-23 ENCOUNTER — Ambulatory Visit: Payer: Medicare Other | Admitting: Family

## 2021-06-27 ENCOUNTER — Encounter: Payer: Self-pay | Admitting: Physician Assistant

## 2021-06-27 ENCOUNTER — Ambulatory Visit (INDEPENDENT_AMBULATORY_CARE_PROVIDER_SITE_OTHER): Payer: Medicare Other | Admitting: Physician Assistant

## 2021-06-27 VITALS — BP 111/42 | HR 61 | Ht 61.0 in | Wt 126.0 lb

## 2021-06-27 DIAGNOSIS — F03918 Unspecified dementia, unspecified severity, with other behavioral disturbance: Secondary | ICD-10-CM

## 2021-06-27 NOTE — Progress Notes (Signed)
Assessment/Plan:   Dementia likely due to    Recommendations:    Continue  10 mg   Side effects were discussed Follow up in   months.   Case discussed with Dr. Karel Jarvis who agrees with the plan    Carla Gregory is a 81 y.o. year old female with risk factors including  age, hypertension, hyperlipidemia, possible history of CHF, chronic Eliquis (Afib vs prior DVT?)  seen today for evaluation of Dementia. Unable to perform MoCA, MMSE 19/30 . Suspect Lewy Body, however her  current mental status may be exacerbated by metabolic issues. She has no recent PCP or cardiac care .      Subjective:    Carla Gregory is a very pleasant 81 y.o. RH female  seen today in follow up for memory loss. This patient is accompanied in the office by her who supplements the history.  Previous records as well as any outside records available were reviewed prior to todays visit.  Patient was last seen at our office on  at which time her  Patient is currently on   Any changes in memory since last visit? Is about the same, confuses the son's names.  Patient lives with: Spouse who noticed changes as well.  Patient lives alone repeats oneself?  Disoriented when walking into a room?  Patient denies   Leaving objects in unusual places?  Patient denies   Ambulates  with difficulty?   Patient denies   Recent falls?  Patient denies   Any head injuries?  Patient denies   History of seizures?   Patient denies   Wandering behavior?  Patient denies   Patient drives?   Patient no longer drives  Patient drives with assistance  Patient uses [employer] to drive   Any mood changes such irritability agitation?  Patient denies   Any history of depression?:  Patient denies   Hallucinations?  Patient denies  they were bad at one point, children saw her hb Paranoia?  Patient denies  she gets jumpy at times  Patient reports that he sleeps well without vivid dreams, REM behavior or sleepwalking   Patient reports vivid dreams    History of sleep apnea?  Patient denies   Any hygiene concerns?  Patient denies   Independent of bathing and dressing?  Endorsed  Does the patient needs help with medications?  pillbox pill pack  Patient denies   Who is in charge of the finances?  Patient is in charge   is in charge   assists the patient  and denies missing any bills   occasionally misses a payment. Any changes in appetite?  Patient denies   Patient have trouble swallowing? Patient denies   Does the patient cook?  Patient denies   Any kitchen accidents such as leaving the stove on? Patient denies   Any headaches?  Patient denies   The double vision? Patient denies   Any focal numbness or tingling?  Patient denies   Chronic back pain Patient denies   Unilateral weakness?  Patient denies   Any tremors?  Patient denies   Any history of anosmia?  Patient denies   Any incontinence of urine?  Patient denies   Any bowel dysfunction?   Patient denies     Constipation     diarrhea   The patient is seen in neurologic consultation at the request of No ref. provider found for the evaluation of memory.  The patient is accompanied by her son who supplements  the history.    Initial visit 12/13/20 This is a 81 y.o. year old right-handed female who was brought from [place] (where she lives alone), to be with her son at the end of November 2022, in view of increased confusion noted by him.  Carla Gregory noticed that she was more confused, wondering around the house, intermittently crying, and demonstrating hallucinations.  She has been with him for the last 3 weeks.  Her behavior seems to be waxing and waning through the hours of the day, but worsens at night.  For example, she becomes disoriented to where the bathroom is, or how to operate the phone. At night, she wakes up and paces around the house.   Early this year, she began showing some signs, calling different family members different names.  She has some hallucinations,  stating that there is some children sitting next to her when no one is there, or stating that someone is sleeping in bed with her or is trying to climb through the window.  At times, she becomes argumentative, especially at night.  She denies vivid dreams or sleepwalking.  It is unclear if she has paranoia.  She attributed this to "getting older".  She does repeat the same stories and asked the same questions.  When they went to visit her, her house was hoarded "and she always had been a very clean person".  Lately, she is very afraid of the shower, and at times she does not know how to change her clothing or underwear.  "She dresses backwards ".  She also shows very mild right greater than left intention tremors not noted earlier this year. No apparent fever, sick contacts or trauma.  She denies a history of depression.  She no longer drives.  Her medications are given by her daughter-in-law.  One son takes care of the finances.  Appetite is fair, she craves for sweets and likes to chew gum.  She denies trouble swallowing.  She does not cook.  She ambulates without difficulties, does not use a walker or a cane.  Denies head injuries.  She denies headaches, double vision, dizziness, focal numbness or tingling, or unilateral weakness.  No history of seizures.  Denies urine incontinence, retention, constipation or diarrhea.  No history of sleep apnea, alcohol or tobacco.  Family history unknown.  Of note, in looking back at old her history in care everywhere, it was noted that she is on chronic Eliquis, at least going back to 2020, and patient and family do not know the specifics of the recent taking this med.  Other findings are suspicious for congestive heart failure as well.  She had a recent history of hyponatremia prior to coming to [place].  Moreover, her most recent chest x-ray does show cardiomegaly.  She denies a history of atrial fibrillation, DVT or PE.    CT Brain 11.26.22 wo contrast : No acute  findings are seen. Cortical sulci are prominent.There is decreased density in the periventricular white matter.There are small calcifications in the basal ganglia.    Labs: NA 132 12/07/20  Ca normal 9.5  Hb 11.5 Hct 34.3, WBC 3.1, plt 169 TSH 1.475 normal  MRI  1. No acute intracranial abnormality.2. Generalized cerebral volume loss but no regions o of disproportionate atrophy identified. Mild for age cerebral white matter signal changes. 3. 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    PREVIOUS MEDICATIONS:   CURRENT MEDICATIONS:  Outpatient Encounter Medications  as of 06/27/2021  Medication Sig   Cholecalciferol (VITAMIN D3) 25 MCG (1000 UT) CAPS Take by mouth daily.   diclofenac Sodium (VOLTAREN) 1 % GEL Apply 2 g topically 4 (four) times daily.   docusate sodium (COLACE) 100 MG capsule Take 1 capsule (100 mg total) by mouth daily.   ELIQUIS 5 MG TABS tablet Take 5 mg by mouth 2 (two) times daily.   losartan (COZAAR) 50 MG tablet Take 1 tablet (50 mg total) by mouth daily.   metolazone (ZAROXOLYN) 2.5 MG tablet Take 2.5 mg by mouth once a week.   rosuvastatin (CRESTOR) 10 MG tablet Take 20 mg by mouth.   spironolactone-hydrochlorothiazide (ALDACTAZIDE) 25-25 MG tablet Take 1 tablet by mouth daily.   vitamin C (ASCORBIC ACID) 500 MG tablet Take 500 mg by mouth daily.   Acetaminophen-Codeine 300-30 MG tablet Take 1 tablet by mouth as needed. (Patient not taking: Reported on 06/27/2021)   diazepam (VALIUM) 5 MG tablet Take 1 tab (5 mg) 30 mins prior tot the MRI. May take another right prior to the procedure if the first dose not therapeutic   No facility-administered encounter medications on file as of 06/27/2021.       12/13/2020    3:00 PM  MMSE - Mini Mental State Exam  Orientation to time 2  Orientation to Place 0  Registration 3  Attention/ Calculation 4  Recall 2  Language- name 2 objects 2  Language-  repeat 1  Language- follow 3 step command 3  Language- read & follow direction 1  Write a sentence 1  Copy design 0  Total score 19       No data to display          Objective:     PHYSICAL EXAMINATION:    VITALS:   Vitals:   06/27/21 1533  BP: (!) 111/42  Pulse: 61  Weight: 126 lb (57.2 kg)  Height: 5\' 1"  (1.549 m)    GEN:  The patient appears stated age and is in NAD. HEENT:  Normocephalic, atraumatic.   Neurological examination:  General: NAD, well-groomed, appears stated age. Orientation: The patient is alert. Oriented to person, place and date Cranial nerves: There is good facial symmetry.The speech is fluent and clear. No aphasia or dysarthria. Fund of knowledge is appropriate. Recent and remote memory are impaired. Attention and concentration are reduced.  Able to name objects and repeat phrases.  Hearing is intact to conversational tone.    Sensation: Sensation is intact to light touch throughout Motor: Strength is at least antigravity x4. Tremors: none  DTR's 2/4 in UE/LE     Movement examination: Tone: There is normal tone in the UE/LE Abnormal movements:  no tremor.  No myoclonus.  No asterixis.   Coordination:  There is no decremation with RAM's. Normal finger to nose  Gait and Station: The patient has no difficulty arising out of a deep-seated chair without the use of the hands. The patient's stride length is good.  Gait is cautious and narrow.    Thank you for allowing the opportunity to participate in the care of this nice patient. Please do not hesitate to contact us for any questions or concerns.   Total time spent on today's visit was *** minutes dedicated to this patient today, preparing to see patient, examining the patient, ordering tests and/or medications and counseling the patient, documenting clinical information in the EHR or other health record, independently interpreting results and communicating results to the patient/family,  discussing treatment and goals, answering patient's questions and coordinating care.  Cc:  Ngetich, Donalee Citrin, NP  Marlowe Kays 06/27/2021 3:55 PM

## 2021-06-27 NOTE — Patient Instructions (Signed)
It was a pleasure to see you today at our office.   Recommendations:  Memantine Take 1 tablet (5mg  at night) for 2 weeks, then increase to 1 tablet (5 mg) twice a day   Follow up in 6 months   Whom to call:  Memory  decline, memory medications: Call our office [phone]   For psychiatric meds, mood meds: Please have your primary care physician manage these medications.   Counseling regarding caregiver distress, including caregiver depression, anxiety and issues regarding community resources, adult day care programs, adult living facilities, or memory care questions:   Feel free to contact Misty [phone], Social Worker at [phone]   For assessment of decision of mental capacity and competency:  Call Dr. [phone], geriatric psychiatrist at [phone]  For guidance in geriatric dementia issues please call [facility] [phone]  For guidance regarding [facility] and if placement were needed at the facility, contact [phone], Social Worker tel: [phone]  If you have any severe symptoms of a stroke, or other severe issues such as confusion,severe chills or fever, etc call 911 or go to the ER as you may need to be evaluated further   Feel free to visit Facebook page " [email or web address]" for tips of how to care for people with memory problems.        RECOMMENDATIONS FOR ALL PATIENTS WITH MEMORY PROBLEMS: 1. Continue to exercise (Recommend 30 minutes of walking everyday, or 3 hours every week) 2. Increase social interactions - continue going to [place] and enjoy social gatherings with friends and family 3. Eat healthy, avoid fried foods and eat more fruits and vegetables 4. Maintain adequate blood pressure, blood sugar, and blood cholesterol level. Reducing the risk of stroke and cardiovascular disease also helps promoting better memory. 5. Avoid stressful situations. Live a simple life and avoid aggravations. Organize your time and  prepare for the next day in anticipation. 6. Sleep well, avoid any interruptions of sleep and avoid any distractions in the bedroom that may interfere with adequate sleep quality 7. Avoid sugar, avoid sweets as there is a strong link between excessive sugar intake, diabetes, and cognitive impairment We discussed the Mediterranean diet, which has been shown to help patients reduce the risk of progressive memory disorders and reduces cardiovascular risk. This includes eating fish, eat fruits and green leafy vegetables, nuts like almonds and hazelnuts, walnuts, and also use olive oil. Avoid fast foods and fried foods as much as possible. Avoid sweets and sugar as sugar use has been linked to worsening of memory function.  There is always a concern of gradual progression of memory problems. If this is the case, then we may need to adjust level of care according to patient needs. Support, both to the patient and caregiver, should then be put into place.    The Alzheimer's Association is here all day, every day for people facing Alzheimer's disease through our free 24/7 Helpline: [phone]. The Helpline provides reliable information and support to all those who need assistance, such as individuals living with memory loss, Alzheimer's or other dementia, caregivers, health care professionals and the public.  Our highly trained and knowledgeable staff can help you with: Understanding memory loss, dementia and Alzheimer's  Medications and other treatment options  General information about aging and brain health  Skills to provide quality care and to find the best care from professionals  Legal, financial and living-arrangement decisions Our Helpline also features: Confidential care consultation provided by master's level  clinicians who can help with decision-making support, crisis assistance and education on issues families face every day  Help in a caller's preferred language using our translation service  that features more than 200 languages and dialects  Referrals to local community programs, services and ongoing support     FALL PRECAUTIONS: Be cautious when walking. Scan the area for obstacles that may increase the risk of trips and falls. When getting up in the mornings, sit up at the edge of the bed for a few minutes before getting out of bed. Consider elevating the bed at the head end to avoid drop of blood pressure when getting up. Walk always in a well-lit room (use night lights in the walls). Avoid area rugs or power cords from appliances in the middle of the walkways. Use a walker or a cane if necessary and consider physical therapy for balance exercise. Get your eyesight checked regularly.  FINANCIAL OVERSIGHT: Supervision, especially oversight when making financial decisions or transactions is also recommended.  HOME SAFETY: Consider the safety of the kitchen when operating appliances like stoves, microwave oven, and blender. Consider having supervision and share cooking responsibilities until no longer able to participate in those. Accidents with firearms and other hazards in the house should be identified and addressed as well.   ABILITY TO BE LEFT ALONE: If patient is unable to contact 911 operator, consider using LifeLine, or when the need is there, arrange for someone to stay with patients. Smoking is a fire hazard, consider supervision or cessation. Risk of wandering should be assessed by caregiver and if detected at any point, supervision and safe proof recommendations should be instituted.  MEDICATION SUPERVISION: Inability to self-administer medication needs to be constantly addressed. Implement a mechanism to ensure safe administration of the medications.      Mediterranean Diet A Mediterranean diet refers to food and lifestyle choices that are based on the traditions of countries located on the [employer]. This way of eating has been shown to help prevent certain  conditions and improve outcomes for people who have chronic diseases, like kidney disease and heart disease. What are tips for following this plan? Lifestyle  Cook and eat meals together with your family, when possible. Drink enough fluid to keep your urine clear or pale yellow. Be physically active every day. This includes: Aerobic exercise like running or swimming. Leisure activities like gardening, walking, or housework. Get 7-8 hours of sleep each night. If recommended by your health care provider, drink red wine in moderation. This means 1 glass a day for nonpregnant women and 2 glasses a day for men. A glass of wine equals 5 oz (150 mL). Reading food labels  Check the serving size of packaged foods. For foods such as rice and pasta, the serving size refers to the amount of cooked product, not dry. Check the total fat in packaged foods. Avoid foods that have saturated fat or trans fats. Check the ingredients list for added sugars, such as corn syrup. Shopping  At the grocery store, buy most of your food from the areas near the walls of the store. This includes: Fresh fruits and vegetables (produce). Grains, beans, nuts, and seeds. Some of these may be available in unpackaged forms or large amounts (in bulk). Fresh seafood. Poultry and eggs. Low-fat dairy products. Buy whole ingredients instead of prepackaged foods. Buy fresh fruits and vegetables in-season from local farmers markets. Buy frozen fruits and vegetables in resealable bags. If you do not have access to quality  fresh seafood, buy precooked frozen shrimp or canned fish, such as tuna, salmon, or sardines. Buy small amounts of raw or cooked vegetables, salads, or olives from the deli or salad bar at your store. Stock your pantry so you always have certain foods on hand, such as olive oil, canned tuna, canned tomatoes, rice, pasta, and beans. Cooking  Cook foods with extra-virgin olive oil instead of using butter or other  vegetable oils. Have meat as a side dish, and have vegetables or grains as your main dish. This means having meat in small portions or adding small amounts of meat to foods like pasta or stew. Use beans or vegetables instead of meat in common dishes like chili or lasagna. Experiment with different cooking methods. Try roasting or broiling vegetables instead of steaming or sauteing them. Add frozen vegetables to soups, stews, pasta, or rice. Add nuts or seeds for added healthy fat at each meal. You can add these to yogurt, salads, or vegetable dishes. Marinate fish or vegetables using olive oil, lemon juice, garlic, and fresh herbs. Meal planning  Plan to eat 1 vegetarian meal one day each week. Try to work up to 2 vegetarian meals, if possible. Eat seafood 2 or more times a week. Have healthy snacks readily available, such as: Vegetable sticks with hummus. Greek yogurt. Fruit and nut trail mix. Eat balanced meals throughout the week. This includes: Fruit: 2-3 servings a day Vegetables: 4-5 servings a day Low-fat dairy: 2 servings a day Fish, poultry, or lean meat: 1 serving a day Beans and legumes: 2 or more servings a week Nuts and seeds: 1-2 servings a day Whole grains: 6-8 servings a day Extra-virgin olive oil: 3-4 servings a day Limit red meat and sweets to only a few servings a month What are my food choices? Mediterranean diet Recommended Grains: Whole-grain pasta. Brown rice. Bulgar wheat. Polenta. Couscous. Whole-wheat bread. Orpah Cobb. Vegetables: Artichokes. Beets. Broccoli. Cabbage. Carrots. Eggplant. Green beans. Chard. Kale. Spinach. Onions. Leeks. Peas. Squash. Tomatoes. Peppers. Radishes. Fruits: Apples. Apricots. Avocado. Berries. Bananas. Cherries. Dates. Figs. Grapes. Lemons. Melon. Oranges. Peaches. Plums. Pomegranate. Meats and other protein foods: Beans. Almonds. Sunflower seeds. Pine nuts. Peanuts. Cod. Salmon. Scallops. Shrimp. Tuna. Tilapia. Clams.  Oysters. Eggs. Dairy: Low-fat milk. Cheese. Greek yogurt. Beverages: Water. Red wine. Herbal tea. Fats and oils: Extra virgin olive oil. Avocado oil. Grape seed oil. Sweets and desserts: [place] yogurt with honey. Baked apples. Poached pears. Trail mix. Seasoning and other foods: Basil. Cilantro. Coriander. Cumin. Mint. Parsley. Sage. Rosemary. Tarragon. Garlic. Oregano. Thyme. Pepper. Balsalmic vinegar. Tahini. Hummus. Tomato sauce. Olives. Mushrooms. Limit these Grains: Prepackaged pasta or rice dishes. Prepackaged cereal with added sugar. Vegetables: Deep fried potatoes (french fries). Fruits: Fruit canned in syrup. Meats and other protein foods: Beef. Pork. Lamb. Poultry with skin. Hot dogs. Tomasa Blase. Dairy: Ice cream. Sour cream. Whole milk. Beverages: Juice. Sugar-sweetened soft drinks. Beer. Liquor and spirits. Fats and oils: Butter. Canola oil. Vegetable oil. Beef fat (tallow). Lard. Sweets and desserts: Cookies. Cakes. Pies. Candy. Seasoning and other foods: Mayonnaise. Premade sauces and marinades. The items listed may not be a complete list. Talk with your dietitian about what dietary choices are right for you. Summary The Mediterranean diet includes both food and lifestyle choices. Eat a variety of fresh fruits and vegetables, beans, nuts, seeds, and whole grains. Limit the amount of red meat and sweets that you eat. Talk with your health care provider about whether it is safe for you to drink red wine  in moderation. This means 1 glass a day for nonpregnant women and 2 glasses a day for men. A glass of wine equals 5 oz (150 mL). This information is not intended to replace advice given to you by your health care provider. Make sure you discuss any questions you have with your health care provider. Document Released: 08/22/2015 Document Revised: 09/24/2015 Document Reviewed: 08/22/2015 Elsevier Interactive Patient Education  2017 [employer].

## 2021-07-01 ENCOUNTER — Encounter: Payer: Self-pay | Admitting: Family

## 2021-07-01 ENCOUNTER — Ambulatory Visit (INDEPENDENT_AMBULATORY_CARE_PROVIDER_SITE_OTHER): Payer: Medicare Other | Admitting: Family

## 2021-07-01 VITALS — BP 120/80 | Temp 97.2°F | Resp 16 | Ht 61.0 in | Wt 130.0 lb

## 2021-07-01 DIAGNOSIS — I482 Chronic atrial fibrillation, unspecified: Secondary | ICD-10-CM

## 2021-07-01 DIAGNOSIS — E785 Hyperlipidemia, unspecified: Secondary | ICD-10-CM

## 2021-07-01 DIAGNOSIS — I10 Essential (primary) hypertension: Secondary | ICD-10-CM | POA: Diagnosis not present

## 2021-07-01 DIAGNOSIS — I5022 Chronic systolic (congestive) heart failure: Secondary | ICD-10-CM

## 2021-07-01 DIAGNOSIS — Z23 Encounter for immunization: Secondary | ICD-10-CM

## 2021-07-01 MED ORDER — TETANUS-DIPHTH-ACELL PERTUSSIS 5-2.5-18.5 LF-MCG/0.5 IM SUSP
0.5000 mL | Freq: Once | INTRAMUSCULAR | 0 refills | Status: AC
Start: 2021-07-01 — End: 2021-07-01

## 2021-07-01 NOTE — Progress Notes (Unsigned)
Location:     Provider: Richarda Blade FNP-C   Sumit Branham, Donalee Citrin, NP  Patient Care Team: Nevayah Faust, Donalee Citrin, NP as PCP - General (Family Medicine)  Extended Emergency Contact Information Primary Emergency Contact: Ullery,Deborah Mobile Phone: [phone] Relation: Daughter  Code Status: Full Code  Goals of care: Advanced Directive information    07/01/2021    9:01 AM  Advanced Directives  Does Patient Have a Medical Advance Directive? Yes  Type of Estate agent of [place];Living will;Out of facility DNR (pink MOST or yellow form)  Does patient want to make changes to medical advance directive? No - Patient declined  Copy of Healthcare Power of Attorney in Chart? No - copy requested     Chief Complaint  Patient presents with   Medical Management of Chronic Issues    4 month follow up.   Labs     Fasting Labs.   Immunizations    Discuss the need for Tetanus vaccine.    HPI:  Pt is a 81 y.o. female seen today for 4 months for medical management of chronic diseases.  She is here with her daughter. She denies any acute issues. Fasting for labs.  Anemia - 9.4  Due for Tetanus vaccine.discussed to get vaccine at her pharmacy.   Past Medical History:  Diagnosis Date   Allergic rhinitis, unspecified    Anemia, unspecified    Bilateral primary osteoarthritis of knee    Body mass index 34.0-34.9, adult    Cardiac murmur, unspecified    Carpal tunnel syndrome, right upper limb    Cervicalgia    Chronic musculoskeletal pain    Disorder of the skin and subcutaneous tissue, unspecified    History of mammogram 2021   Hypertension    Osteoarthritis of knee    Other abnormal glucose    Other dorsalgia    Other hyperlipidemia    Other myositis, unspecified shoulder    Pain in left hip    Pain in left knee    Pain in unspecified knee    Polymyalgia rheumatica (HCC)    Postnasal drip    Primary generalized (osteo)arthritis    Primary osteoarthritis  of knee    Sciatica of left side    Sleep apnea    Vitamin D deficiency    History reviewed. No pertinent surgical history.  Allergies  Allergen Reactions   Shellfish Allergy Hives    Allergies as of 07/01/2021       Reactions   Shellfish Allergy Hives        Medication List        Accurate as of July 01, 2021  9:19 AM. If you have any questions, ask your nurse or doctor.          STOP taking these medications    acetaminophen-codeine 300-30 MG tablet Commonly known as: TYLENOL #3 Stopped by: Donalee Citrin Leetta Hendriks, NP   diazepam 5 MG tablet Commonly known as: Valium Stopped by: Caesar Bookman, NP       TAKE these medications    diclofenac Sodium 1 % Gel Commonly known as: VOLTAREN Apply 2 g topically 4 (four) times daily.   docusate sodium 100 MG capsule Commonly known as: Colace Take 1 capsule (100 mg total) by mouth daily.   Eliquis 5 MG Tabs tablet Generic drug: apixaban Take 5 mg by mouth 2 (two) times daily.   losartan 50 MG tablet Commonly known as: COZAAR Take 1 tablet (50 mg total) by mouth daily.  metolazone 2.5 MG tablet Commonly known as: ZAROXOLYN Take 2.5 mg by mouth once a week.   rosuvastatin 10 MG tablet Commonly known as: CRESTOR Take 20 mg by mouth.   spironolactone-hydrochlorothiazide 25-25 MG tablet Commonly known as: ALDACTAZIDE Take 1 tablet by mouth daily.   vitamin C 500 MG tablet Commonly known as: ASCORBIC ACID Take 500 mg by mouth daily.   Vitamin D3 25 MCG (1000 UT) Caps Take by mouth daily.        Review of Systems  Constitutional:  Negative for appetite change, chills, fatigue, fever and unexpected weight change.  HENT:  Negative for congestion, dental problem, ear discharge, ear pain, facial swelling, hearing loss, nosebleeds, postnasal drip, rhinorrhea, sinus pressure, sinus pain, sneezing, sore throat, tinnitus and trouble swallowing.   Eyes:  Negative for pain, discharge, redness, itching and visual  disturbance.  Respiratory:  Negative for cough, chest tightness, shortness of breath and wheezing.   Cardiovascular:  Negative for chest pain, palpitations and leg swelling.  Gastrointestinal:  Positive for constipation. Negative for abdominal distention, abdominal pain, blood in stool, diarrhea, nausea and vomiting.  Endocrine: Negative for cold intolerance, heat intolerance, polydipsia, polyphagia and polyuria.  Genitourinary:  Negative for difficulty urinating, dysuria, flank pain, frequency and urgency.  Musculoskeletal:  Positive for gait problem. Negative for arthralgias, back pain, joint swelling, myalgias, neck pain and neck stiffness.  Skin:  Negative for color change, pallor, rash and wound.  Neurological:  Negative for dizziness, syncope, speech difficulty, weakness, light-headedness, numbness and headaches.  Hematological:  Does not bruise/bleed easily.  Psychiatric/Behavioral:  Negative for agitation, behavioral problems, confusion, hallucinations, self-injury, sleep disturbance and suicidal ideas. The patient is not nervous/anxious.     Immunization History  Administered Date(s) Administered   Moderna Sars-Covid-2 Vaccination 02/24/2019, 03/24/2019, 11/15/2019   PFIZER(Purple Top)SARS-COV-2 Vaccination 05/18/2020, 10/16/2020, 05/16/2021   Pneumococcal Conjugate-13 12/12/2016   Pneumococcal Polysaccharide-23 09/24/2018   Zoster Recombinat (Shingrix) 10/26/2018, 05/03/2019   Pertinent  Health Maintenance Due  Topic Date Due   INFLUENZA VACCINE  08/12/2021   DEXA SCAN  Completed      12/07/2020   11:47 AM 12/13/2020    3:15 PM 01/16/2021    1:24 PM 06/27/2021    3:35 PM 07/01/2021    9:00 AM  Fall Risk  Falls in the past year?  1 1 0 0  Was there an injury with Fall?  1 1 0 0  Fall Risk Category Calculator  3 2 0 0  Fall Risk Category  High Moderate Low Low  Patient Fall Risk Level High fall risk High fall risk Moderate fall risk Low fall risk Low fall risk  Patient at  Risk for Falls Due to   History of fall(s)  No Fall Risks  Fall risk Follow up   Falls evaluation completed;Education provided;Falls prevention discussed  Falls evaluation completed   Functional Status Survey:    Vitals:   07/01/21 0853  BP: 120/80  Resp: 16  Temp: (!) 97.2 F (36.2 C)  Weight: 130 lb (59 kg)  Height: 5\' 1"  (1.549 m)   Body mass index is 24.56 kg/m. Physical Exam Vitals reviewed.  Constitutional:      General: She is not in acute distress.    Appearance: Normal appearance. She is normal weight. She is not ill-appearing or diaphoretic.  HENT:     Head: Normocephalic.     Right Ear: Tympanic membrane, ear canal and external ear normal. There is no impacted cerumen.  Left Ear: Tympanic membrane, ear canal and external ear normal. There is no impacted cerumen.     Nose: Nose normal. No congestion or rhinorrhea.     Mouth/Throat:     Mouth: Mucous membranes are moist.     Pharynx: Oropharynx is clear. No oropharyngeal exudate or posterior oropharyngeal erythema.  Eyes:     General: No scleral icterus.       Right eye: No discharge.        Left eye: No discharge.     Extraocular Movements: Extraocular movements intact.     Conjunctiva/sclera: Conjunctivae normal.     Pupils: Pupils are equal, round, and reactive to light.  Neck:     Vascular: No carotid bruit.  Cardiovascular:     Rate and Rhythm: Normal rate and regular rhythm.     Pulses: Normal pulses.     Heart sounds: Normal heart sounds. No murmur heard.    No friction rub. No gallop.  Pulmonary:     Effort: Pulmonary effort is normal. No respiratory distress.     Breath sounds: Normal breath sounds. No wheezing, rhonchi or rales.  Chest:     Chest wall: No tenderness.  Abdominal:     General: Bowel sounds are normal. There is no distension.     Palpations: Abdomen is soft. There is no mass.     Tenderness: There is no abdominal tenderness. There is no right CVA tenderness, left CVA tenderness,  guarding or rebound.  Musculoskeletal:        General: No swelling or tenderness. Normal range of motion.     Cervical back: Normal range of motion. No rigidity or tenderness.     Right lower leg: No edema.     Left lower leg: No edema.  Lymphadenopathy:     Cervical: No cervical adenopathy.  Skin:    General: Skin is warm and dry.     Coloration: Skin is not pale.     Findings: No bruising, erythema, lesion or rash.  Neurological:     Mental Status: She is alert and oriented to person, place, and time.     Cranial Nerves: No cranial nerve deficit.     Sensory: No sensory deficit.     Motor: No weakness.     Coordination: Coordination normal.     Gait: Gait normal.  Psychiatric:        Mood and Affect: Mood normal.        Speech: Speech normal.        Behavior: Behavior normal.        Thought Content: Thought content normal.        Judgment: Judgment normal.     Labs reviewed: Recent Labs    12/07/20 1248 01/22/21 0854 03/03/21 0930  NA 132* 130* 135  K 3.4* 3.9 4.0  CL 98 94* 98  CO2 25 29 30   GLUCOSE 107* 84 75  BUN 24* 10 14  CREATININE 1.14* 0.96* 0.90  CALCIUM 9.5 9.7 9.4   Recent Labs    07/23/20 0000 12/07/20 1248 01/22/21 0854  AST 21 20 20   ALT 10 11 8   ALKPHOS 50 41  --   BILITOT  --  0.4 0.6  PROT  --  7.4 6.8  ALBUMIN  --  3.7  --    Recent Labs    07/23/20 0000 12/07/20 1248 01/22/21 0854  WBC 3.2 3.1* 3.2*  NEUTROABS  --   --  1,965  HGB 9.7* 11.5* 10.5*  HCT 29* 34.3* 31.1*  MCV  --  93.5 94.5  PLT 208 169 227   Lab Results  Component Value Date   TSH 1.30 12/27/2020   No results found for: "HGBA1C" Lab Results  Component Value Date   CHOL 212 (H) 03/03/2021   HDL 69 03/03/2021   LDLCALC 133 (H) 03/03/2021   TRIG 34 03/03/2021   CHOLHDL 3.1 03/03/2021    Significant Diagnostic Results in last 30 days:  No results found.  Assessment/Plan There are no diagnoses linked to this encounter.   Family/ staff Communication:  Reviewed plan of care with patient  Labs/tests ordered: None   Next Appointment :   Caesar Bookman, NP

## 2021-07-02 LAB — COMPLETE METABOLIC PANEL WITH GFR
AG Ratio: 1.3 (calc) (ref 1.0–2.5)
ALT: 7 U/L (ref 6–29)
AST: 19 U/L (ref 10–35)
Albumin: 3.9 g/dL (ref 3.6–5.1)
Alkaline phosphatase (APISO): 47 U/L (ref 37–153)
BUN: 18 mg/dL (ref 7–25)
CO2: 29 mmol/L (ref 20–32)
Calcium: 9.6 mg/dL (ref 8.6–10.4)
Chloride: 95 mmol/L — ABNORMAL LOW (ref 98–110)
Creat: 0.83 mg/dL (ref 0.60–0.95)
Globulin: 2.9 g/dL (calc) (ref 1.9–3.7)
Glucose, Bld: 82 mg/dL (ref 65–99)
Potassium: 4.1 mmol/L (ref 3.5–5.3)
Sodium: 131 mmol/L — ABNORMAL LOW (ref 135–146)
Total Bilirubin: 0.4 mg/dL (ref 0.2–1.2)
Total Protein: 6.8 g/dL (ref 6.1–8.1)
eGFR: 70 mL/min/{1.73_m2} (ref >=60)

## 2021-07-02 LAB — TSH: TSH: 1.83 mIU/L (ref 0.40–4.50)

## 2021-07-02 LAB — CBC WITH DIFFERENTIAL/PLATELET
Absolute Monocytes: 242 cells/uL (ref 200–950)
Basophils Absolute: 20 cells/uL (ref 0–200)
Basophils Relative: 0.9 %
Eosinophils Absolute: 70 cells/uL (ref 15–500)
Eosinophils Relative: 3.2 %
HCT: 29.5 % — ABNORMAL LOW (ref 35.0–45.0)
Hemoglobin: 9.8 g/dL — ABNORMAL LOW (ref 11.7–15.5)
Lymphs Abs: 744 cells/uL — ABNORMAL LOW (ref 850–3900)
MCH: 31 pg (ref 27.0–33.0)
MCHC: 33.2 g/dL (ref 32.0–36.0)
MCV: 93.4 fL (ref 80.0–100.0)
MPV: 10.9 fL (ref 7.5–12.5)
Monocytes Relative: 11 %
Neutro Abs: 1124 cells/uL — ABNORMAL LOW (ref 1500–7800)
Neutrophils Relative %: 51.1 %
Platelets: 168 10*3/uL (ref 140–400)
RBC: 3.16 10*6/uL — ABNORMAL LOW (ref 3.80–5.10)
RDW: 12.3 % (ref 11.0–15.0)
Total Lymphocyte: 33.8 %
WBC: 2.2 10*3/uL — ABNORMAL LOW (ref 3.8–10.8)

## 2021-07-03 ENCOUNTER — Telehealth: Payer: Self-pay | Admitting: Physician Assistant

## 2021-07-03 ENCOUNTER — Other Ambulatory Visit: Payer: Self-pay | Admitting: Cardiovascular Disease

## 2021-07-03 ENCOUNTER — Other Ambulatory Visit: Payer: Self-pay

## 2021-07-03 DIAGNOSIS — D649 Anemia, unspecified: Secondary | ICD-10-CM

## 2021-07-03 NOTE — Telephone Encounter (Signed)
Patient's daughter called and said the pharmacy hasn't received the prescription for memantine that was prescribed.  [facility] [address] [facility] like a call back once this has been sent please.

## 2021-07-03 NOTE — Addendum Note (Signed)
Addended by: Cheron Every C on: 07/03/2021 10:58 AM   Modules accepted: Orders

## 2021-07-04 ENCOUNTER — Other Ambulatory Visit: Payer: Self-pay | Admitting: Physician Assistant

## 2021-07-04 MED ORDER — MEMANTINE HCL 5 MG PO TABS: ORAL_TABLET | ORAL | 11 refills | Status: DC

## 2021-07-04 NOTE — Telephone Encounter (Signed)
Rx was sent in and daughter notified and thanked me for calling.

## 2021-07-15 LAB — FECAL GLOBIN BY IMMUNOCHEMISTRY

## 2021-07-16 ENCOUNTER — Other Ambulatory Visit: Payer: Medicare Other

## 2021-07-16 DIAGNOSIS — D649 Anemia, unspecified: Secondary | ICD-10-CM

## 2021-07-16 DIAGNOSIS — E785 Hyperlipidemia, unspecified: Secondary | ICD-10-CM

## 2021-07-17 ENCOUNTER — Other Ambulatory Visit: Payer: Self-pay | Admitting: Family

## 2021-07-17 ENCOUNTER — Other Ambulatory Visit: Payer: Self-pay

## 2021-07-17 DIAGNOSIS — D649 Anemia, unspecified: Secondary | ICD-10-CM

## 2021-07-17 LAB — CBC WITH DIFFERENTIAL/PLATELET
Absolute Monocytes: 223 cells/uL (ref 200–950)
Basophils Absolute: 10 cells/uL (ref 0–200)
Basophils Relative: 0.4 %
Eosinophils Absolute: 50 cells/uL (ref 15–500)
Eosinophils Relative: 2.1 %
HCT: 28.5 % — ABNORMAL LOW (ref 35.0–45.0)
Hemoglobin: 9.3 g/dL — ABNORMAL LOW (ref 11.7–15.5)
Lymphs Abs: 742 cells/uL — ABNORMAL LOW (ref 850–3900)
MCH: 30.8 pg (ref 27.0–33.0)
MCHC: 32.6 g/dL (ref 32.0–36.0)
MCV: 94.4 fL (ref 80.0–100.0)
MPV: 10.9 fL (ref 7.5–12.5)
Monocytes Relative: 9.3 %
Neutro Abs: 1375 cells/uL — ABNORMAL LOW (ref 1500–7800)
Neutrophils Relative %: 57.3 %
Platelets: 173 10*3/uL (ref 140–400)
RBC: 3.02 10*6/uL — ABNORMAL LOW (ref 3.80–5.10)
RDW: 11.9 % (ref 11.0–15.0)
Total Lymphocyte: 30.9 %
WBC: 2.4 10*3/uL — ABNORMAL LOW (ref 3.8–10.8)

## 2021-07-17 LAB — LIPID PANEL
Cholesterol: 181 mg/dL (ref <200)
HDL: 78 mg/dL (ref >=50)
LDL Cholesterol (Calc): 90 mg/dL (calc)
Non-HDL Cholesterol (Calc): 103 mg/dL (calc) (ref <130)
Total CHOL/HDL Ratio: 2.3 (calc) (ref <5.0)
Triglycerides: 43 mg/dL (ref <150)

## 2021-07-17 NOTE — Progress Notes (Signed)
Order printed by PCP Ngetich, Donalee Citrin, NP and given to Lab Fredrich Birks by me.

## 2021-07-17 NOTE — Progress Notes (Signed)
Encounter routed to PCP Bruk Tumolo C, NP . Unsure of which order to place. There are many options. Please get with lab tech and confirm correct order for patient.    Addendum:  Fecal occult blood ordered.

## 2021-07-17 NOTE — Addendum Note (Signed)
Addended by: Meda Klinefelter E on: 07/17/2021 01:05 PM   Modules accepted: Orders

## 2021-07-17 NOTE — Progress Notes (Signed)
Fecal occult blood test  order updated not done today.

## 2021-07-17 NOTE — Progress Notes (Signed)
[facility] tech reports patient's Fecal Globin by immunochemistry not done by lab states FOBT card was expired.Need new order for patient to recollect stool specimen.Patient's daughter will pick up specimen card later today.

## 2021-07-22 ENCOUNTER — Other Ambulatory Visit: Payer: Self-pay

## 2021-07-22 MED ORDER — ELIQUIS 5 MG PO TABS: 5.0000 mg | ORAL_TABLET | Freq: Two times a day (BID) | ORAL | 0 refills | Status: DC

## 2021-07-28 ENCOUNTER — Other Ambulatory Visit: Payer: Self-pay

## 2021-07-28 ENCOUNTER — Encounter (HOSPITAL_BASED_OUTPATIENT_CLINIC_OR_DEPARTMENT_OTHER): Payer: Self-pay

## 2021-07-28 ENCOUNTER — Emergency Department (HOSPITAL_BASED_OUTPATIENT_CLINIC_OR_DEPARTMENT_OTHER): Payer: Medicare Other

## 2021-07-28 ENCOUNTER — Emergency Department (HOSPITAL_BASED_OUTPATIENT_CLINIC_OR_DEPARTMENT_OTHER)
Admission: EM | Admit: 2021-07-28 | Discharge: 2021-07-28 | Disposition: A | Discharge status: Home / Self Care | Payer: Medicare Other | Attending: Emergency Medicine | Admitting: Emergency Medicine

## 2021-07-28 DIAGNOSIS — R519 Headache, unspecified: Secondary | ICD-10-CM | POA: Insufficient documentation

## 2021-07-28 DIAGNOSIS — F0390 Unspecified dementia without behavioral disturbance: Secondary | ICD-10-CM | POA: Diagnosis not present

## 2021-07-28 DIAGNOSIS — Z7901 Long term (current) use of anticoagulants: Secondary | ICD-10-CM | POA: Insufficient documentation

## 2021-07-28 DIAGNOSIS — Z79899 Other long term (current) drug therapy: Secondary | ICD-10-CM | POA: Diagnosis not present

## 2021-07-28 DIAGNOSIS — I5022 Chronic systolic (congestive) heart failure: Secondary | ICD-10-CM | POA: Insufficient documentation

## 2021-07-28 DIAGNOSIS — I110 Hypertensive heart disease with heart failure: Secondary | ICD-10-CM | POA: Insufficient documentation

## 2021-07-28 DIAGNOSIS — R51 Headache with orthostatic component, not elsewhere classified: Secondary | ICD-10-CM

## 2021-07-28 LAB — BASIC METABOLIC PANEL
Anion gap: 8 (ref 5–15)
BUN: 16 mg/dL (ref 8–23)
CO2: 26 mmol/L (ref 22–32)
Calcium: 9.3 mg/dL (ref 8.9–10.3)
Chloride: 96 mmol/L — ABNORMAL LOW (ref 98–111)
Creatinine, Ser: 1.02 mg/dL — ABNORMAL HIGH (ref 0.44–1.00)
GFR, Estimated: 55 mL/min — ABNORMAL LOW (ref >60)
Glucose, Bld: 97 mg/dL (ref 70–99)
Potassium: 3.8 mmol/L (ref 3.5–5.1)
Sodium: 130 mmol/L — ABNORMAL LOW (ref 135–145)

## 2021-07-28 LAB — CBC WITH DIFFERENTIAL/PLATELET
Abs Immature Granulocytes: 0.01 10*3/uL (ref 0.00–0.07)
Basophils Absolute: 0 10*3/uL (ref 0.0–0.1)
Basophils Relative: 0 %
Eosinophils Absolute: 0 10*3/uL (ref 0.0–0.5)
Eosinophils Relative: 2 %
HCT: 30.5 % — ABNORMAL LOW (ref 36.0–46.0)
Hemoglobin: 10.1 g/dL — ABNORMAL LOW (ref 12.0–15.0)
Immature Granulocytes: 0 %
Lymphocytes Relative: 29 %
Lymphs Abs: 0.8 10*3/uL (ref 0.7–4.0)
MCH: 30.7 pg (ref 26.0–34.0)
MCHC: 33.1 g/dL (ref 30.0–36.0)
MCV: 92.7 fL (ref 80.0–100.0)
Monocytes Absolute: 0.3 10*3/uL (ref 0.1–1.0)
Monocytes Relative: 10 %
Neutro Abs: 1.5 10*3/uL — ABNORMAL LOW (ref 1.7–7.7)
Neutrophils Relative %: 59 %
Platelets: 173 10*3/uL (ref 150–400)
RBC: 3.29 MIL/uL — ABNORMAL LOW (ref 3.87–5.11)
RDW: 12 % (ref 11.5–15.5)
WBC: 2.6 10*3/uL — ABNORMAL LOW (ref 4.0–10.5)
nRBC: 0 % (ref 0.0–0.2)

## 2021-07-28 LAB — TROPONIN I (HIGH SENSITIVITY): Troponin I (High Sensitivity): 4 ng/L (ref <18)

## 2021-07-28 NOTE — ED Triage Notes (Signed)
States she was washing her feet and when she bent down she felt a pain in her head. States has been getting tingling to left leg daily.

## 2021-07-28 NOTE — Discharge Instructions (Addendum)
You are seen in the emergency department after having pain on the side of your head when you sat up this morning to wash your feet.  We did not see any evidence of a stroke or a heart attack.  We checked your blood pressure and check you for dizziness while walking did not see any orthostatic hypotension or ataxia.  We did find some premature ventricular complexes on your electrocardiogram.  We would like you to follow-up with cardiology to have them evaluate this.  We recommend following up with your primary care physician in the next week.  If you experience any new falls, return of th that head pain, chest pain, shortness of breath, dizziness, syncope, or any other bothersome symptoms please return to the emergency room.

## 2021-07-28 NOTE — ED Provider Notes (Signed)
[facility] EMERGENCY DEPARTMENT Provider Note   CSN: [id number] Arrival date & time: 07/28/21  1402     History  No chief complaint on file.   Carla Gregory is a 81 y.o. female.  Carla Gregory is an 81 year old female with past medical history of hypertension, heart failure, anemia, and dementia who presents after an episode of head pain this morning.  She states she was cleaning her feet and when she sat up after this she had 15 minutes of pain on the sides of her head bilaterally and associated burping.  Patient does not recall if she had any palpitations during the event but denied shortness of breath, chest pain, nausea, vomiting, dizziness, syncope.  She has had some tachycardic palpitations since this event but again denies any chest pain or shortness of breath.  She has bilateral lower extremity edema which is stable today.  She also had a fall about a week ago without syncope when she was not using her cane and brace herself with her right arm.  She denied any dizziness, chest pain, palpitations, shortness of breath, or head injury with that fall.  Denies any current head pain or other bothersome symptoms. 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        Home Medications Prior to Admission medications   Medication Sig Start Date End Date Taking? Authorizing Provider  Cholecalciferol (VITAMIN D3) 25 MCG (1000 UT) CAPS Take by mouth daily.    [provider]  diclofenac Sodium (VOLTAREN) 1 % GEL Apply 2 g topically 4 (four) times daily. 11/13/20   [provider]  docusate sodium (COLACE) 100 MG capsule Take 1 capsule (100 mg total) by mouth daily. 02/24/21   Ngetich, Dinah C, NP  ELIQUIS 5 MG TABS tablet Take 1 tablet (5 mg total) by mouth 2 (two) times daily. 07/22/21   Ngetich, Dinah C, NP  losartan (COZAAR) 50 MG tablet TAKE 1 TABLET BY MOUTH EVERY DAY 07/04/21   O'Neal, Ronnald Ramp, MD  memantine (NAMENDA) 5 MG tablet Take 1 tablet (5 mg at night) for 2 weeks, then increase to 1 tablet (5 mg) twice a day 07/04/21   Marcos Eke, PA-C  metolazone (ZAROXOLYN) 2.5 MG tablet Take 2.5 mg by mouth once a week.    [provider]  rosuvastatin (CRESTOR) 10 MG tablet Take 20 mg by mouth.    [provider]  spironolactone-hydrochlorothiazide (ALDACTAZIDE) 25-25 MG tablet Take 1 tablet by mouth daily. 11/20/20   [provider]  vitamin C (ASCORBIC ACID) 500 MG tablet Take 500 mg by mouth daily.    [provider]      Allergies    Shellfish allergy    Review of Systems   Review of Systems  Constitutional:  Positive for activity change (Slightly decreased activity after a fall last week.). Negative for appetite change, chills, diaphoresis and fever.  Eyes:  Negative for visual disturbance.  Respiratory:  Negative for chest tightness and shortness of breath.   Cardiovascular:  Positive for palpitations and leg swelling. Negative for chest pain.  Gastrointestinal:  Negative for abdominal pain, diarrhea, nausea and vomiting.  Genitourinary:  Negative for difficulty urinating, dysuria and hematuria.  Neurological:  Negative for dizziness and syncope.    Physical Exam Updated Vital Signs BP (!) 152/73   Pulse 72   Temp 99.2 F (37.3 C) (Oral)   Resp 16  Ht 5\' 1"  (1.549 m)   Wt 59 kg   SpO2 97%   BMI 24.56 kg/m  Physical Exam Vitals reviewed.  Constitutional:      General: She is not in acute distress.    Appearance: She is well-developed and well-groomed.  Eyes:     Extraocular Movements: Extraocular movements intact.     Pupils: Pupils are equal, round, and reactive to light.  Cardiovascular:     Rate and Rhythm: Regular rhythm. Bradycardia present.  Pulmonary:     Effort: Pulmonary effort is normal.     Breath sounds: Normal breath sounds.  Musculoskeletal:     Right lower leg: Tenderness present. 2+ Pitting Edema  present.     Left lower leg: Tenderness present. 2+ Pitting Edema present.  Neurological:     General: No focal deficit present.     Mental Status: She is alert and oriented to person, place, and time.     Cranial Nerves: Cranial nerves 2-12 are intact.     Sensory: No sensory deficit (Bilateral lower extremities).     Motor: Weakness (5/5 strength in the bilateral upper extremities.  4/5 strength right leg flexion, 3/5 strength left leg flexion.) present.     Coordination: Romberg sign negative.     Gait: Gait is intact.  Psychiatric:        Attention and Perception: She is attentive.        Speech: Speech normal.        Behavior: Behavior is cooperative.     ED Results / Procedures / Treatments   Labs (all labs ordered are listed, but only abnormal results are displayed) Labs Reviewed  BASIC METABOLIC PANEL - Abnormal; Notable for the following components:      Result Value   Sodium 130 (*)    Chloride 96 (*)    Creatinine, Ser 1.02 (*)    GFR, Estimated 55 (*)    All other components within normal limits  CBC WITH DIFFERENTIAL/PLATELET - Abnormal; Notable for the following components:   WBC 2.6 (*)    RBC 3.29 (*)    Hemoglobin 10.1 (*)    HCT 30.5 (*)    Neutro Abs 1.5 (*)    All other components within normal limits  TROPONIN I (HIGH SENSITIVITY)  TROPONIN I (HIGH SENSITIVITY)    EKG None  Radiology CT Head Wo Contrast  Result Date: 07/28/2021 CLINICAL DATA:  Worsening headache. EXAM: CT HEAD WITHOUT CONTRAST TECHNIQUE: Contiguous axial images were obtained from the base of the skull through the vertex without intravenous contrast. RADIATION DOSE REDUCTION: This exam was performed according to the departmental dose-optimization program which includes automated exposure control, adjustment of the mA and/or kV according to patient size and/or use of iterative reconstruction technique. COMPARISON:  MRI brain 01/18/2021.  Head CT 12/07/2021. FINDINGS: Brain: There is no  evidence for acute hemorrhage, hydrocephalus, mass lesion, or abnormal extra-axial fluid collection. No definite CT evidence for acute infarction. Diffuse loss of parenchymal volume is consistent with atrophy. Patchy low attenuation in the deep hemispheric and periventricular white matter is nonspecific, but likely reflects chronic microvascular ischemic demyelination. Vascular: No hyperdense vessel or unexpected calcification. Skull: No evidence for fracture. No worrisome lytic or sclerotic lesion. Sinuses/Orbits: The visualized paranasal sinuses and mastoid air cells are clear. Visualized portions of the globes and intraorbital fat are unremarkable. Other: None. IMPRESSION: 1. No acute intracranial abnormality. 2. Atrophy with chronic small vessel ischemic disease. Electronically Signed   By: 12/09/2021  M.D.   On: 07/28/2021 14:45    Procedures Procedures    Medications Ordered in ED Medications - No data to display  ED Course/ Medical Decision Making/ A&P                           Medical Decision Making Segovia is an 81 year old female past medical history of hypertension, heart failure, anemia, and dementia who today after having a postural headache this morning.  Work-up included CT head without contrast, BMP, CBC, EKG, troponin.  There is no evidence of acute hemorrhagic stroke or cardiac ischemia.  There were no signs of orthostatic hypotension or cerebellar ataxia.  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  She has seen Dr. Lennie Odor in the past and his office has been contacted and updated on this information.  Patient was stable throughout emergency room visit and was discharged with close primary care and cardiology follow-up.  All questions were answered prior to discharge.  Problems Addressed: Chronic systolic congestive heart failure [facility]): chronic illness or injury Postural headache: acute illness or injury  Amount and/or Complexity of Data Reviewed Labs: ordered.  Decision-making details documented in ED Course. Radiology: ordered and independent interpretation performed. Decision-making details documented in ED Course. ECG/medicine tests: ordered and independent interpretation performed. Decision-making details documented in ED Course.          Final Clinical Impression(s) / ED Diagnoses Final diagnoses:  Postural headache  Chronic systolic congestive heart failure (HCC)    Rx / DC Orders ED Discharge Orders          Ordered    Ambulatory referral to Cardiology       Comments: If you have not heard from the Cardiology office within the next 72 hours please call [phone].   07/28/21 1903              Rocky Morel, DO 07/28/21 2020    Franne Forts, DO 07/31/21 [phone]

## 2021-07-28 NOTE — ED Notes (Signed)
PT ambulated with no complaints per normal at home. Resident Physician at bedside to assist

## 2021-07-28 NOTE — ED Notes (Signed)
Denies headache/other symptoms during triage

## 2021-07-30 ENCOUNTER — Other Ambulatory Visit: Payer: Self-pay | Admitting: Physician Assistant

## 2021-08-06 LAB — FECAL GLOBIN BY IMMUNOCHEMISTRY
FECAL GLOBIN RESULT:: NOT DETECTED
MICRO NUMBER:: 13691207
SPECIMEN QUALITY:: ADEQUATE

## 2021-08-11 ENCOUNTER — Telehealth: Payer: Self-pay | Admitting: *Deleted

## 2021-08-11 MED ORDER — ROSUVASTATIN CALCIUM 5 MG PO TABS: 5.0000 mg | ORAL_TABLET | Freq: Every day | ORAL | 1 refills | Status: AC

## 2021-08-11 NOTE — Telephone Encounter (Signed)
Confirmed dosage with daughter and patient is taking Rosuvastatin 5mg .   Medication list updated and Rx sent to pharmacy.

## 2021-08-11 NOTE — Telephone Encounter (Signed)
Verify dosage then refill.According to chart should be on 20 mg tablet daily.

## 2021-08-11 NOTE — Telephone Encounter (Signed)
Patient daughter, [name] called requesting a refill for patient.   Stated that patient takes Rosuvastatin 5mg  once daily at bedtime and needing a refill.   Our current medication list has a different dosage.   Is this ok to change to 5mg  and refill.  Please Advise.

## 2021-08-21 ENCOUNTER — Ambulatory Visit: Payer: Medicare Other | Admitting: Cardiovascular Disease

## 2021-08-21 NOTE — Progress Notes (Signed)
Cardiology Office Note:   Date:  08/22/2021  NAME:  Carla Gregory    MRN: [id number] DOB:  04-09-1940   PCP:  Sandrea Hughs, NP  Cardiologist:  None  Electrophysiologist:  None   Referring MD: Lianne Cure, DO   Chief Complaint  Patient presents with   Follow-up         History of Present Illness:   Carla Gregory is a 81 y.o. female with a hx of dementia, and htn who presents for follow-up.  She was recently evaluated the emergency room for headache.  She was found to be in ventricular trigeminy.  She was sent back to [place].  She now is relocated to [place].  She was residing in [place].  Much of her medical history is unclear.  We have been unable to obtain records from her cardiologist in [place].  We will reach back out.  She remains on Eliquis.  Unclear for this reason.  She is not aware.  She now has developed dementia.  Her EKG today demonstrates sinus bradycardia heart rate 51 with no acute ischemic changes.  Recent TSH is normal at 1.83.  She is chronically anemic.  She has had work-up in [place] in the past.  No cancer was found.  She does have lower extremity edema.  Worse at the end of the day.  Improved with leg elevation.  She presents with her son and daughter-in-law.  She seems to be in good spirits today.  She is a bit confused on her medical history but overall doing well.  Problem List HTN Dementia  Anemia  Past Medical History: Past Medical History:  Diagnosis Date   Allergic rhinitis, unspecified    Anemia, unspecified    Bilateral primary osteoarthritis of knee    Body mass index 34.0-34.9, adult    Cardiac murmur, unspecified    Carpal tunnel syndrome, right upper limb    Cervicalgia    Chronic musculoskeletal pain    Disorder of the skin and subcutaneous tissue, unspecified    History of mammogram 2021   Hypertension    Osteoarthritis of knee    Other abnormal glucose    Other dorsalgia    Other hyperlipidemia    Other  myositis, unspecified shoulder    Pain in left hip    Pain in left knee    Pain in unspecified knee    Polymyalgia rheumatica (HCC)    Postnasal drip    Primary generalized (osteo)arthritis    Primary osteoarthritis of knee    Sciatica of left side    Sleep apnea    Vitamin D deficiency     Past Surgical History: History reviewed. No pertinent surgical history.  Current Medications: Current Meds  Medication Sig   Cholecalciferol (VITAMIN D3) 25 MCG (1000 UT) CAPS Take by mouth daily.   docusate sodium (COLACE) 100 MG capsule Take 1 capsule (100 mg total) by mouth daily.   ELIQUIS 5 MG TABS tablet Take 1 tablet (5 mg total) by mouth 2 (two) times daily.   furosemide (LASIX) 20 MG tablet Take 1 tablet (20 mg total) by mouth daily.   losartan (COZAAR) 50 MG tablet TAKE 1 TABLET BY MOUTH EVERY DAY   memantine (NAMENDA) 5 MG tablet TAKE 1 TABLET (5 MG AT NIGHT) FOR 2 WEEKS, THEN INCREASE TO 1 TABLET (5 MG) TWICE A DAY   rosuvastatin (CRESTOR) 5 MG tablet Take 1 tablet (5 mg total) by mouth daily.  spironolactone (ALDACTONE) 25 MG tablet Take 1 tablet (25 mg total) by mouth daily.   vitamin C (ASCORBIC ACID) 500 MG tablet Take 500 mg by mouth daily.   [DISCONTINUED] metolazone (ZAROXOLYN) 2.5 MG tablet Take 2.5 mg by mouth once a week.   [DISCONTINUED] spironolactone-hydrochlorothiazide (ALDACTAZIDE) 25-25 MG tablet Take 1 tablet by mouth daily.     Allergies:    Shellfish allergy   Social History: Social History   Socioeconomic History   Marital status: Widowed    Spouse name: Not on file   Number of children: 7   Years of education: Not on file   Highest education level: Not on file  Occupational History   Occupation: Retired  Tobacco Use   Smoking status: Former    Types: Cigarettes   Smokeless tobacco: Never  Scientific laboratory technician Use: Never used  Substance and Sexual Activity   Alcohol use: Never   Drug use: Never   Sexual activity: Not on file  Other Topics  Concern   Not on file  Social History Narrative   Diet:      Caffeine: coffee      Married, if yes what year: widowed      Do you live in a house, apartment, assisted living, condo, trailer, ect: house      Is it one or more stories: one      How many persons live in your home? one      Pets: no      Highest level or education completed: 10 grade      Current/Past profession: none      Exercise: no                Type and how often:          Living Will: Yes   DNR:   POA/HPOA:      Functional Status:   Do you have difficulty bathing or dressing yourself?   Do you have difficulty preparing food or eating? Yes   Do you have difficulty managing your medications? Yes   Do you have difficulty managing your finances? No   Do you have difficulty affording your medications? Yes   Social Determinants of Health   Financial Resource Strain: Not on file  Food Insecurity: Not on file  Transportation Needs: Not on file  Physical Activity: Not on file  Stress: Not on file  Social Connections: Not on file     Family History: The patient's family history includes Cancer in her father and mother; Cancer (age of onset: 65) in her brother; Cancer (age of onset: 93) in her sister; Clotting disorder (age of onset: 82) in her daughter; Hypertension in her mother.  ROS:   All other ROS reviewed and negative. Pertinent positives noted in the HPI.     EKGs/Labs/Other Studies Reviewed:   The following studies were personally reviewed by me today:  EKG:  EKG is ordered today.  The ekg ordered today demonstrates sinus bradycardia heart rate 51, no acute ischemic changes or evidence of infarction, and was personally reviewed by me.   Recent Labs: 07/01/2021: ALT 7; TSH 1.83 07/28/2021: BUN 16; Creatinine, Ser 1.02; Hemoglobin 10.1; Platelets 173; Potassium 3.8; Sodium 130   Recent Lipid Panel    Component Value Date/Time   CHOL 181 07/16/2021 0915   TRIG 43 07/16/2021 0915   HDL 78  07/16/2021 0915   CHOLHDL 2.3 07/16/2021 0915   LDLCALC 90 07/16/2021 0915  Physical Exam:   VS:  BP (!) 150/82   Pulse (!) 51   Ht 5\' 1"  (1.549 m)   Wt 127 lb (57.6 kg)   SpO2 98%   BMI 24.00 kg/m    Wt Readings from Last 3 Encounters:  08/22/21 127 lb (57.6 kg)  07/28/21 130 lb (59 kg)  07/01/21 130 lb (59 kg)    General: Well nourished, well developed, in no acute distress Head: Atraumatic, normal size  Eyes: PEERLA, EOMI  Neck: Supple, no JVD Endocrine: No thryomegaly Cardiac: Normal S1, S2; RRR; no murmurs, rubs, or gallops Lungs: Clear to auscultation bilaterally, no wheezing, rhonchi or rales  Abd: Soft, nontender, no hepatomegaly  Ext: Trace edema Musculoskeletal: No deformities, BUE and BLE strength normal and equal Skin: Warm and dry, no rashes   Neuro: Alert and oriented to person, place, time, and situation, CNII-XII grossly intact, no focal deficits  Psych: Normal mood and affect   ASSESSMENT:   Carla Gregory is a 81 y.o. female who presents for the following: 1. Leg edema   2. Primary hypertension   3. PVC (premature ventricular contraction)   4. Venous insufficiency     PLAN:   1. Leg edema -She has lower extremity edema.  She is only on metolazone once a week.  Stop this.  Stop her Aldactone HCTZ combination.  Start Aldactone 25 mg daily.  Start Lasix 20 mg daily.  She needs an echocardiogram.  We will set her up for this.  Does not appear volume overloaded.  Unclear details of her cardiac history.  We will reach back out to her cardiologist in [place] city.  We will do our work-up.  2. Primary hypertension -Starting Lasix as above.  Continue Aldactone.  Continue losartan 50 mg daily.  3. PVC (premature ventricular contraction) -PVCs on EKG from emergency room.  They have resolved.  Check echo as above.  Recent thyroid study is normal.  EKG today shows sinus bradycardia with no acute ischemic changes or evidence of infarction.  4. Venous  insufficiency -Her edema is likely related to venous insufficiency as well.  We are checking an echo and above work-up.  I have recommended compression stockings as well.  Disposition: Return in about 3 months (around 11/22/2021).  Medication Adjustments/Labs and Tests Ordered: Current medicines are reviewed at length with the patient today.  Concerns regarding medicines are outlined above.  Orders Placed This Encounter  Procedures   EKG 12-Lead   ECHOCARDIOGRAM COMPLETE   Meds ordered this encounter  Medications   spironolactone (ALDACTONE) 25 MG tablet    Sig: Take 1 tablet (25 mg total) by mouth daily.    Dispense:  90 tablet    Refill:  3   furosemide (LASIX) 20 MG tablet    Sig: Take 1 tablet (20 mg total) by mouth daily.    Dispense:  90 tablet    Refill:  3    Patient Instructions  Medication Instructions:  STOP Aldactazide  STOP Metolazone  START Aldactone 25 mg daily  START Lasix 20 mg daily   *If you need a refill on your cardiac medications before your next appointment, please call your pharmacy*   Testing/Procedures: Echocardiogram - Your physician has requested that you have an echocardiogram. Echocardiography is a painless test that uses sound waves to create images of your heart. It provides your doctor with information about the size and shape of your heart and how well your heart's chambers and valves are working. This  procedure takes approximately one hour. There are no restrictions for this procedure.     Follow-Up: At [facility], you and your health needs are our priority.  As part of our continuing mission to provide you with exceptional heart care, we have created designated Provider Care Teams.  These Care Teams include your primary Cardiologist (physician) and Advanced Practice Providers (APPs -  Physician Assistants and Nurse Practitioners) who all work together to provide you with the care you need, when you need it.  We recommend signing up  for the patient portal called "MyChart".  Sign up information is provided on this After Visit Summary.  MyChart is used to connect with patients for Virtual Visits (Telemedicine).  Patients are able to view lab/test results, encounter notes, upcoming appointments, etc.  Non-urgent messages can be sent to your provider as well.   To learn more about what you can do with MyChart, go to [email or web address].    Your next appointment:   3 month(s)  The format for your next appointment:   In Person  Provider:   Lennie Odor, MD{                    Time Spent with Patient: I have spent a total of 35 minutes with patient reviewing hospital notes, telemetry, EKGs, labs and examining the patient as well as establishing an assessment and plan that was discussed with the patient.  > 50% of time was spent in direct patient care.  Signed, Lenna Gilford. Flora Lipps, MD, [facility]  [facility]  [address], [address] [place], [place] [address] [phone]  08/22/2021 4:39 PM

## 2021-08-22 ENCOUNTER — Ambulatory Visit (INDEPENDENT_AMBULATORY_CARE_PROVIDER_SITE_OTHER): Payer: Medicare Other | Admitting: Cardiovascular Disease

## 2021-08-22 ENCOUNTER — Encounter: Payer: Self-pay | Admitting: Cardiovascular Disease

## 2021-08-22 VITALS — BP 150/82 | HR 51 | Ht 61.0 in | Wt 127.0 lb

## 2021-08-22 DIAGNOSIS — I872 Venous insufficiency (chronic) (peripheral): Secondary | ICD-10-CM | POA: Diagnosis not present

## 2021-08-22 DIAGNOSIS — I493 Ventricular premature depolarization: Secondary | ICD-10-CM

## 2021-08-22 DIAGNOSIS — I10 Essential (primary) hypertension: Secondary | ICD-10-CM

## 2021-08-22 DIAGNOSIS — R600 Localized edema: Secondary | ICD-10-CM

## 2021-08-22 MED ORDER — FUROSEMIDE 20 MG PO TABS: 20.0000 mg | ORAL_TABLET | Freq: Every day | ORAL | 3 refills | Status: AC

## 2021-08-22 MED ORDER — SPIRONOLACTONE 25 MG PO TABS: 25.0000 mg | ORAL_TABLET | Freq: Every day | ORAL | 3 refills | Status: AC

## 2021-08-22 NOTE — Patient Instructions (Addendum)
Medication Instructions:  STOP Aldactazide  STOP Metolazone  START Aldactone 25 mg daily  START Lasix 20 mg daily   *If you need a refill on your cardiac medications before your next appointment, please call your pharmacy*   Testing/Procedures: Echocardiogram - Your physician has requested that you have an echocardiogram. Echocardiography is a painless test that uses sound waves to create images of your heart. It provides your doctor with information about the size and shape of your heart and how well your heart's chambers and valves are working. This procedure takes approximately one hour. There are no restrictions for this procedure.     Follow-Up: At [facility], you and your health needs are our priority.  As part of our continuing mission to provide you with exceptional heart care, we have created designated Provider Care Teams.  These Care Teams include your primary Cardiologist (physician) and Advanced Practice Providers (APPs -  Physician Assistants and Nurse Practitioners) who all work together to provide you with the care you need, when you need it.  We recommend signing up for the patient portal called "MyChart".  Sign up information is provided on this After Visit Summary.  MyChart is used to connect with patients for Virtual Visits (Telemedicine).  Patients are able to view lab/test results, encounter notes, upcoming appointments, etc.  Non-urgent messages can be sent to your provider as well.   To learn more about what you can do with MyChart, go to [email or web address].    Your next appointment:   3 month(s)  The format for your next appointment:   In Person  Provider:   Lennie Odor, MD{

## 2021-09-05 ENCOUNTER — Ambulatory Visit (HOSPITAL_COMMUNITY): Payer: Medicare Other | Attending: Cardiology

## 2021-09-05 DIAGNOSIS — I493 Ventricular premature depolarization: Secondary | ICD-10-CM | POA: Insufficient documentation

## 2021-09-05 DIAGNOSIS — R600 Localized edema: Secondary | ICD-10-CM | POA: Insufficient documentation

## 2021-09-05 LAB — ECHOCARDIOGRAM COMPLETE
Area-P 1/2: 3.2 cm2
P 1/2 time: 941 msec
S' Lateral: 3.1 cm

## 2021-10-10 ENCOUNTER — Encounter (HOSPITAL_BASED_OUTPATIENT_CLINIC_OR_DEPARTMENT_OTHER): Payer: Self-pay

## 2021-10-10 ENCOUNTER — Emergency Department (HOSPITAL_BASED_OUTPATIENT_CLINIC_OR_DEPARTMENT_OTHER)
Admission: EM | Admit: 2021-10-10 | Discharge: 2021-10-10 | Disposition: A | Discharge status: Home / Self Care | Payer: Medicare Other | Attending: Emergency Medicine | Admitting: Emergency Medicine

## 2021-10-10 ENCOUNTER — Emergency Department (HOSPITAL_BASED_OUTPATIENT_CLINIC_OR_DEPARTMENT_OTHER): Payer: Medicare Other

## 2021-10-10 ENCOUNTER — Other Ambulatory Visit: Payer: Self-pay

## 2021-10-10 DIAGNOSIS — M25552 Pain in left hip: Secondary | ICD-10-CM | POA: Diagnosis present

## 2021-10-10 DIAGNOSIS — I509 Heart failure, unspecified: Secondary | ICD-10-CM | POA: Diagnosis not present

## 2021-10-10 DIAGNOSIS — M25561 Pain in right knee: Secondary | ICD-10-CM | POA: Insufficient documentation

## 2021-10-10 DIAGNOSIS — Z79899 Other long term (current) drug therapy: Secondary | ICD-10-CM | POA: Insufficient documentation

## 2021-10-10 DIAGNOSIS — I110 Hypertensive heart disease with heart failure: Secondary | ICD-10-CM | POA: Diagnosis not present

## 2021-10-10 DIAGNOSIS — M25562 Pain in left knee: Secondary | ICD-10-CM | POA: Diagnosis not present

## 2021-10-10 NOTE — ED Provider Notes (Signed)
[facility] EMERGENCY DEPARTMENT Provider Note   CSN: [id number] Arrival date & time: 10/10/21  1711     History  Chief Complaint  Patient presents with   Hip Pain    Carla Gregory is a 81 y.o. female.  Patient with hx of hypertension, hyperlipidemia, and CHF presents with acute left hip pain for 1 day. She reports severe hip pain yesterday afternoon. She had difficulty ambulating and took tylenol for pain relief. Pain rated at 10/10 yesterday but 1/10 today. 2 months ago she reports a fall in the bathroom when reaching for an item and landed on her left side. She was fine and able to ambulate so she did not go to ED. She lives with her son and she ambulates with a cane.   The history is provided by the patient and a relative.  Hip Pain     Home Medications Prior to Admission medications   Medication Sig Start Date End Date Taking? Authorizing Provider  Cholecalciferol (VITAMIN D3) 25 MCG (1000 UT) CAPS Take by mouth daily.    [provider]  diclofenac Sodium (VOLTAREN) 1 % GEL Apply 2 g topically 4 (four) times daily. Patient not taking: Reported on 08/22/2021 11/13/20   [provider]  docusate sodium (COLACE) 100 MG capsule Take 1 capsule (100 mg total) by mouth daily. 02/24/21   Ngetich, Dinah C, NP  ELIQUIS 5 MG TABS tablet Take 1 tablet (5 mg total) by mouth 2 (two) times daily. 07/22/21   Ngetich, Dinah C, NP  furosemide (LASIX) 20 MG tablet Take 1 tablet (20 mg total) by mouth daily. 08/22/21 08/17/22  Geralynn Rile, MD  losartan (COZAAR) 50 MG tablet TAKE 1 TABLET BY MOUTH EVERY DAY 07/04/21   O'Neal, Cassie Freer, MD  memantine (NAMENDA) 5 MG tablet TAKE 1 TABLET (5 MG AT NIGHT) FOR 2 WEEKS, THEN INCREASE TO 1 TABLET (5 MG) TWICE A DAY 07/30/21   Shawn Route, Coralee Pesa, PA-C  rosuvastatin (CRESTOR) 5 MG tablet Take 1 tablet (5 mg total) by mouth daily. 08/11/21   Ngetich, Dinah C, NP  spironolactone (ALDACTONE) 25 MG tablet Take 1 tablet (25 mg  total) by mouth daily. 08/22/21 08/17/22  O'NealCassie Freer, MD  vitamin C (ASCORBIC ACID) 500 MG tablet Take 500 mg by mouth daily.    [provider]      Allergies    Shellfish allergy    Review of Systems   Review of Systems  Constitutional:  Negative for chills and fever.  Musculoskeletal:        Left hip pain, bilateral knee pain  Neurological:  Negative for weakness and numbness.    Physical Exam Updated Vital Signs BP 111/68 (BP Location: Right Arm)   Pulse 60   Temp 98.6 F (37 C) (Oral)   Resp 18   Wt 57.9 kg   SpO2 100%   BMI 24.13 kg/m  Physical Exam Constitutional:      General: She is not in acute distress.    Appearance: She is not ill-appearing.  HENT:     Head: Normocephalic and atraumatic.  Pulmonary:     Effort: Pulmonary effort is normal.  Musculoskeletal:     Right hip: Normal.     Left hip: Normal. No deformity or tenderness. Normal range of motion.     Right knee: Tenderness present.     Left knee: Tenderness present.  Skin:    General: Skin is warm and dry.  Neurological:  Mental Status: She is alert.     ED Results / Procedures / Treatments   Labs (all labs ordered are listed, but only abnormal results are displayed) Labs Reviewed - No data to display  EKG None  Radiology DG Pelvis 1-2 Views  Result Date: 10/10/2021 CLINICAL DATA:  Fall 2 months ago.  Left leg pain. EXAM: PELVIS - 1-2 VIEW; LEFT FEMUR 2 VIEWS; LEFT KNEE - COMPLETE 4+ VIEW COMPARISON:  None Available. FINDINGS: Pelvis: There is no acute fracture or dislocation. Femoroacetabular alignment is maintained bilaterally. There is mild degenerative change about both hips. The SI joints and symphysis pubis are intact. The soft tissues are unremarkable. Femur: There is no acute fracture or dislocation. Bony alignment is normal. The soft tissues are unremarkable. Knee: There is no acute fracture or dislocation. Knee alignment is normal. There is moderate  tricompartmental joint space narrowing with associated subchondral sclerosis and osteophytosis, consistent with osteoarthritis. There is chondrocalcinosis in the knee joint. The soft tissues are unremarkable. There is no effusion. IMPRESSION: 1. No evidence of acute injury in the pelvis, femur, or knee. 2. Moderate tricompartmental osteoarthritis about the knee. 3. 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 Electronically Signed   By: Lesia Hausen M.D.   On: 10/10/2021 18:42   DG Femur Min 2 Views Left  Result Date: 10/10/2021 CLINICAL DATA:  Fall 2 months ago.  Left leg pain. EXAM: PELVIS - 1-2 VIEW; LEFT FEMUR 2 VIEWS; LEFT KNEE - COMPLETE 4+ VIEW COMPARISON:  None Available. FINDINGS: Pelvis: There is no acute fracture or dislocation. Femoroacetabular alignment is maintained bilaterally. There is mild degenerative change about both hips. The SI joints and symphysis pubis are intact. The soft tissues are unremarkable. Femur: There is no acute fracture or dislocation. Bony alignment is normal. The soft tissues are unremarkable. Knee: There is no acute fracture or dislocation. Knee alignment is normal. There is moderate tricompartmental joint space narrowing with associated subchondral sclerosis and osteophytosis, consistent with osteoarthritis. There is chondrocalcinosis in the knee joint. The soft tissues are unremarkable. There is no effusion. IMPRESSION: 1. No evidence of acute injury in the pelvis, femur, or knee. 2. Moderate tricompartmental osteoarthritis about the knee. 3. 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 Electronically Signed   By: Lesia Hausen M.D.   On: 10/10/2021 18:42   DG Knee Complete 4 Views Left  Result Date: 10/10/2021 CLINICAL DATA:  Fall 2 months ago.  Left leg pain. EXAM: PELVIS - 1-2 VIEW; LEFT FEMUR 2 VIEWS; LEFT KNEE - COMPLETE 4+ VIEW COMPARISON:  None Available. FINDINGS: Pelvis: There is no acute fracture or dislocation. Femoroacetabular alignment is maintained bilaterally. There is  mild degenerative change about both hips. The SI joints and symphysis pubis are intact. The soft tissues are unremarkable. Femur: There is no acute fracture or dislocation. Bony alignment is normal. The soft tissues are unremarkable. Knee: There is no acute fracture or dislocation. Knee alignment is normal. There is moderate tricompartmental joint space narrowing with associated subchondral sclerosis and osteophytosis, consistent with osteoarthritis. There is chondrocalcinosis in the knee joint. The soft tissues are unremarkable. There is no effusion. IMPRESSION: 1. No evidence of acute injury in the pelvis, femur, or knee. 2. Moderate tricompartmental osteoarthritis about the knee. 3. 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 Electronically Signed   By: Lesia Hausen M.D.   On: 10/10/2021 18:42    Procedures Procedures    Medications Ordered in ED Medications - No data to display  ED Course/ Medical Decision Making/ A&P  Medical Decision Making Amount and/or Complexity of Data Reviewed Radiology: ordered.    Details: No acute fractures seen on xray of pelvis, left femur or knee. Osteoarthritis and chondrocalcinosis noted of left knee.    Patient presents with acute left hip pain for 1 day. Pain has improved with Tylenol. Able to ambulate with cane. X-ray reassuring and did not show any acute fractures. Patient is stable for discharge today. Discussed taking tylenol for hip pain. Will provide information for orthopedics. Return precautions given.    Final Clinical Impression(s) / ED Diagnoses Final diagnoses:  Left hip pain    Rx / DC Orders ED Discharge Orders     None         Angelique Blonder, DO 10/10/21 2019    Drenda Freeze, MD 10/11/21 [phone]

## 2021-10-10 NOTE — ED Triage Notes (Signed)
Pt brought in by son.Reports she fell 2 months ago on left side while in [place]. Pain to left hip and knee has increased over past 2 days

## 2021-10-10 NOTE — ED Notes (Signed)
Patient resting quietly in bed. Family at bedside. C/O left hip pain x 2 days.

## 2021-10-10 NOTE — Discharge Instructions (Addendum)
You were evaluated for left hip pain. Xrays were reassuring and did not show any fractures. Please take tylenol as needed for the hip pain. Follow up with your primary care doctor. If pain worsens or becomes persistent, please return to the emergency department.

## 2021-10-14 ENCOUNTER — Telehealth: Payer: Self-pay

## 2021-10-14 NOTE — Telephone Encounter (Signed)
Transition Care Management Follow-up Telephone Call Date of discharge and from where: 10/10/2021, [facility] How have you been since you were released from the hospital? Hip still bothersome Any questions or concerns? No  Items Reviewed: Did the pt receive and understand the discharge instructions provided? Yes  Medications obtained and verified? Yes  Other? No  Any new allergies since your discharge? No  Dietary orders reviewed? Not applicable  Do you have support at home? Yes   Home Care and Equipment/Supplies: Were home health services ordered? not applicable If so, what is the name of the agency? N/A  Has the agency set up a time to come to the patient's home? not applicable Were any new equipment or medical supplies ordered?  No What is the name of the medical supply agency? N/A Were you able to get the supplies/equipment? not applicable Do you have any questions related to the use of the equipment or supplies? No  Functional Questionnaire: (I = Independent and D = Dependent) ADLs: D  Bathing/Dressing- I  Meal Prep- D  Eating- I  Maintaining continence- I  Transferring/Ambulation- D  Managing Meds- D  Follow up appointments reviewed:  PCP Hospital f/u appt confirmed? Yes  Scheduled to see Ngetich, Nelda Bucks, NP on 10/15/2021 @ 220PM. [facility] f/u appt confirmed? No  Scheduled to see N/A on n/a @ N/A. Are transportation arrangements needed? No  If their condition worsens, is the pt aware to call PCP or go to the Emergency Dept.? Yes Was the patient provided with contact information for the PCP's office or ED? Yes Was to pt encouraged to call back with questions or concerns? Yes

## 2021-10-16 ENCOUNTER — Ambulatory Visit (INDEPENDENT_AMBULATORY_CARE_PROVIDER_SITE_OTHER): Payer: Medicare Other | Admitting: Family

## 2021-10-16 ENCOUNTER — Encounter: Payer: Self-pay | Admitting: Family

## 2021-10-16 VITALS — BP 118/72 | HR 55 | Temp 97.5°F | Resp 16 | Ht 61.0 in | Wt 126.2 lb

## 2021-10-16 DIAGNOSIS — M170 Bilateral primary osteoarthritis of knee: Secondary | ICD-10-CM | POA: Diagnosis not present

## 2021-10-16 DIAGNOSIS — M25552 Pain in left hip: Secondary | ICD-10-CM

## 2021-10-16 DIAGNOSIS — K5901 Slow transit constipation: Secondary | ICD-10-CM | POA: Diagnosis not present

## 2021-10-16 DIAGNOSIS — Z23 Encounter for immunization: Secondary | ICD-10-CM | POA: Diagnosis not present

## 2021-10-16 DIAGNOSIS — G8929 Other chronic pain: Secondary | ICD-10-CM | POA: Insufficient documentation

## 2021-10-16 MED ORDER — SENNOSIDES-DOCUSATE SODIUM 8.6-50 MG PO TABS: 1.0000 | ORAL_TABLET | Freq: Every day | ORAL | 3 refills | Status: AC

## 2021-10-16 NOTE — Progress Notes (Signed)
Provider: Richarda Blade FNP-C  Cordarro Spinnato, Donalee Citrin, NP  Patient Care Team: Wilbur Oakland, Donalee Citrin, NP as PCP - General (Family Medicine)  Extended Emergency Contact Information Primary Emergency Contact: Rouse,Marvin Home Phone: [phone] Relation: Son  Code Status:  Full Code  Goals of care: Advanced Directive information    10/16/2021    2:53 PM  Advanced Directives  Does Patient Have a Medical Advance Directive? Yes  Type of Estate agent of [place];Living will;Out of facility DNR (pink MOST or yellow form)  Does patient want to make changes to medical advance directive? No - Patient declined  Copy of Healthcare Power of Attorney in Chart? No - copy requested     Chief Complaint  Patient presents with   Hospitalization Follow-up    Hospital follow up 10/10/2021 for left hip pain.    HPI:  Pt is a 81 y.o. female seen today for transition of care For follow-up ED visit on 10/10/2021 for left hip pain x1 day.  She reported that she had severe hip pain the day before she went to the ED. She had difficulty ambulating and took Tylenol for pain with some relief.  She rated pain 10 out of 10 to the ED visit. States 2 months ago she sustained a fall in the bathroom when reaching for an item and landed on the left side she was able to walk without any difficulties so she did not go to the emergency room for evaluation. She walks with a cane and lives with her son. In the ED she was noted to have normal range of motion of the left hip and left knee but had some tenderness over the knees.  X-ray done did not show any fractures but showed osteoarthritis.  She requested to take only Tylenol for pain with much improvement.  She was able to ambulate without any difficulties.  She was discharged home and advised to follow-up with her PCP and also schedule appointment with Dr. Margarita Rana orthopedic for left hip and knee pain evaluation.  Daughter states needs referral to  orthopedic was not given the number to call.  Constipation - has take MOM,Miralax and colace without any relief.Eats veggies but daughter Clint Bolder 't think she eats enough.  She denies any abdominal pain, nausea,vomiting or diarrhea.   Past Medical History:  Diagnosis Date   Allergic rhinitis, unspecified    Anemia, unspecified    Bilateral primary osteoarthritis of knee    Body mass index 34.0-34.9, adult    Cardiac murmur, unspecified    Carpal tunnel syndrome, right upper limb    Cervicalgia    Chronic musculoskeletal pain    Disorder of the skin and subcutaneous tissue, unspecified    History of mammogram 2021   Hypertension    Osteoarthritis of knee    Other abnormal glucose    Other dorsalgia    Other hyperlipidemia    Other myositis, unspecified shoulder    Pain in left hip    Pain in left knee    Pain in unspecified knee    Polymyalgia rheumatica (HCC)    Postnasal drip    Primary generalized (osteo)arthritis    Primary osteoarthritis of knee    Sciatica of left side    Sleep apnea    Vitamin D deficiency    History reviewed. No pertinent surgical history.  Allergies  Allergen Reactions   Shellfish Allergy Hives    Outpatient Encounter Medications as of 10/16/2021  Medication Sig   Cholecalciferol (  VITAMIN D3) 25 MCG (1000 UT) CAPS Take by mouth daily.   docusate sodium (COLACE) 100 MG capsule Take 1 capsule (100 mg total) by mouth daily.   ELIQUIS 5 MG TABS tablet Take 1 tablet (5 mg total) by mouth 2 (two) times daily.   furosemide (LASIX) 20 MG tablet Take 1 tablet (20 mg total) by mouth daily.   losartan (COZAAR) 50 MG tablet TAKE 1 TABLET BY MOUTH EVERY DAY   memantine (NAMENDA) 5 MG tablet TAKE 1 TABLET (5 MG AT NIGHT) FOR 2 WEEKS, THEN INCREASE TO 1 TABLET (5 MG) TWICE A DAY   rosuvastatin (CRESTOR) 5 MG tablet Take 1 tablet (5 mg total) by mouth daily.   senna-docusate (SENOKOT-S) 8.6-50 MG tablet Take 1 tablet by mouth at bedtime.   spironolactone  (ALDACTONE) 25 MG tablet Take 1 tablet (25 mg total) by mouth daily.   vitamin C (ASCORBIC ACID) 500 MG tablet Take 500 mg by mouth daily.   [DISCONTINUED] diclofenac Sodium (VOLTAREN) 1 % GEL Apply 2 g topically 4 (four) times daily. (Patient not taking: Reported on 08/22/2021)   No facility-administered encounter medications on file as of 10/16/2021.    Review of Systems  Constitutional:  Negative for appetite change, chills, fatigue, fever and unexpected weight change.  HENT:  Negative for congestion, ear discharge, ear pain, facial swelling, hearing loss, nosebleeds, postnasal drip, rhinorrhea, sinus pressure, sinus pain, sneezing, sore throat, tinnitus and trouble swallowing.   Eyes:  Negative for pain, discharge, redness, itching and visual disturbance.  Respiratory:  Negative for cough, chest tightness, shortness of breath and wheezing.   Cardiovascular:  Negative for chest pain, palpitations and leg swelling.  Gastrointestinal:  Negative for abdominal distention, abdominal pain, blood in stool, constipation, diarrhea, nausea and vomiting.  Endocrine: Negative for cold intolerance, heat intolerance, polydipsia, polyphagia and polyuria.  Genitourinary:  Negative for difficulty urinating, dysuria, flank pain, frequency and urgency.  Musculoskeletal:  Positive for arthralgias and gait problem. Negative for back pain, joint swelling, myalgias, neck pain and neck stiffness.       Left hip and bilateral knee pain   Skin:  Negative for color change, pallor, rash and wound.  Neurological:  Negative for dizziness, syncope, speech difficulty, weakness, light-headedness, numbness and headaches.  Hematological:  Does not bruise/bleed easily.  Psychiatric/Behavioral:  Negative for agitation, behavioral problems, confusion, hallucinations, self-injury, sleep disturbance and suicidal ideas. The patient is not nervous/anxious.     Immunization History  Administered Date(s) Administered   Fluad  Quad(high Dose 65+) 10/16/2021   Moderna Sars-Covid-2 Vaccination 02/24/2019, 03/24/2019, 11/15/2019   PFIZER(Purple Top)SARS-COV-2 Vaccination 05/18/2020, 10/16/2020, 05/16/2021   Pneumococcal Conjugate-13 12/12/2016   Pneumococcal Polysaccharide-23 09/24/2018   Zoster Recombinat (Shingrix) 10/26/2018, 05/03/2019   Pertinent  Health Maintenance Due  Topic Date Due   INFLUENZA VACCINE  Completed   DEXA SCAN  Completed      06/27/2021    3:35 PM 07/01/2021    9:00 AM 07/28/2021    2:20 PM 10/10/2021    5:23 PM 10/16/2021    2:52 PM  Fall Risk  Falls in the past year? 0 0   1  Was there an injury with Fall? 0 0   0  Fall Risk Category Calculator 0 0   1  Fall Risk Category Low Low   Low  Patient Fall Risk Level Low fall risk Low fall risk Low fall risk High fall risk Low fall risk  Patient at Risk for Falls Due to  No  Fall Risks   History of fall(s);Impaired balance/gait;Impaired mobility  Fall risk Follow up  Falls evaluation completed   Falls evaluation completed;Education provided;Falls prevention discussed   Functional Status Survey:    Vitals:   10/16/21 1444  BP: 118/72  Pulse: (!) 55  Resp: 16  Temp: (!) 97.5 F (36.4 C)  SpO2: 93%  Weight: 126 lb 3.2 oz (57.2 kg)  Height: 5\' 1"  (1.549 m)   Body mass index is 23.85 kg/m. Physical Exam Vitals reviewed.  Constitutional:      General: She is not in acute distress.    Appearance: Normal appearance. She is normal weight. She is not ill-appearing or diaphoretic.  HENT:     Head: Normocephalic.     Mouth/Throat:     Mouth: Mucous membranes are moist.     Pharynx: Oropharynx is clear. No oropharyngeal exudate or posterior oropharyngeal erythema.  Eyes:     General: No scleral icterus.       Right eye: No discharge.        Left eye: No discharge.     Conjunctiva/sclera: Conjunctivae normal.     Pupils: Pupils are equal, round, and reactive to light.  Neck:     Vascular: No carotid bruit.  Cardiovascular:     Rate  and Rhythm: Normal rate and regular rhythm.     Pulses: Normal pulses.     Heart sounds: Normal heart sounds. No murmur heard.    No friction rub. No gallop.  Pulmonary:     Effort: Pulmonary effort is normal. No respiratory distress.     Breath sounds: Normal breath sounds. No wheezing, rhonchi or rales.  Chest:     Chest wall: No tenderness.  Abdominal:     General: Bowel sounds are normal. There is no distension.     Palpations: Abdomen is soft. There is no mass.     Tenderness: There is no abdominal tenderness. There is no right CVA tenderness, left CVA tenderness, guarding or rebound.  Musculoskeletal:        General: No swelling.     Cervical back: Normal range of motion. No rigidity or tenderness.     Right hip: No deformity or tenderness. Normal range of motion. Normal strength.     Left hip: Tenderness present. No deformity. Normal range of motion. Normal strength.     Right knee: No swelling, deformity, effusion, erythema or ecchymosis. Normal range of motion. Tenderness present.     Left knee: No swelling, deformity, effusion, erythema or ecchymosis. Normal range of motion. Tenderness present.     Right lower leg: No edema.     Left lower leg: No edema.  Lymphadenopathy:     Cervical: No cervical adenopathy.  Skin:    General: Skin is warm and dry.     Coloration: Skin is not pale.     Findings: No bruising, erythema, lesion or rash.  Neurological:     Mental Status: She is alert and oriented to person, place, and time.     Cranial Nerves: No cranial nerve deficit.     Sensory: No sensory deficit.     Motor: No weakness.     Coordination: Coordination normal.     Gait: Gait normal.  Psychiatric:        Mood and Affect: Mood normal.        Speech: Speech normal.        Behavior: Behavior normal.        Thought Content: Thought content normal.  Judgment: Judgment normal.     Labs reviewed: Recent Labs    03/03/21 0930 07/01/21 0956 07/28/21 1756  NA  135 131* 130*  K 4.0 4.1 3.8  CL 98 95* 96*  CO2 30 29 26   GLUCOSE 75 82 97  BUN 14 18 16   CREATININE 0.90 0.83 1.02*  CALCIUM 9.4 9.6 9.3   Recent Labs    12/07/20 1248 01/22/21 0854 07/01/21 0956  AST 20 20 19   ALT 11 8 7   ALKPHOS 41  --   --   BILITOT 0.4 0.6 0.4  PROT 7.4 6.8 6.8  ALBUMIN 3.7  --   --    Recent Labs    07/01/21 0956 07/16/21 0915 07/28/21 1756  WBC 2.2* 2.4* 2.6*  NEUTROABS 1,124* 1,375* 1.5*  HGB 9.8* 9.3* 10.1*  HCT 29.5* 28.5* 30.5*  MCV 93.4 94.4 92.7  PLT 168 173 173   Lab Results  Component Value Date   TSH 1.83 07/01/2021   No results found for: "HGBA1C" Lab Results  Component Value Date   CHOL 181 07/16/2021   HDL 78 07/16/2021   LDLCALC 90 07/16/2021   TRIG 43 07/16/2021   CHOLHDL 2.3 07/16/2021    Significant Diagnostic Results in last 30 days:  DG Pelvis 1-2 Views  Result Date: 10/10/2021 CLINICAL DATA:  Fall 2 months ago.  Left leg pain. EXAM: PELVIS - 1-2 VIEW; LEFT FEMUR 2 VIEWS; LEFT KNEE - COMPLETE 4+ VIEW COMPARISON:  None Available. FINDINGS: Pelvis: There is no acute fracture or dislocation. Femoroacetabular alignment is maintained bilaterally. There is mild degenerative change about both hips. The SI joints and symphysis pubis are intact. The soft tissues are unremarkable. Femur: There is no acute fracture or dislocation. Bony alignment is normal. The soft tissues are unremarkable. Knee: There is no acute fracture or dislocation. Knee alignment is normal. There is moderate tricompartmental joint space narrowing with associated subchondral sclerosis and osteophytosis, consistent with osteoarthritis. There is chondrocalcinosis in the knee joint. The soft tissues are unremarkable. There is no effusion. IMPRESSION: 1. No evidence of acute injury in the pelvis, femur, or knee. 2. Moderate tricompartmental osteoarthritis about the knee. 3. 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 Electronically Signed   By: Valetta Mole M.D.   On:  10/10/2021 18:42   DG Femur Min 2 Views Left  Result Date: 10/10/2021 CLINICAL DATA:  Fall 2 months ago.  Left leg pain. EXAM: PELVIS - 1-2 VIEW; LEFT FEMUR 2 VIEWS; LEFT KNEE - COMPLETE 4+ VIEW COMPARISON:  None Available. FINDINGS: Pelvis: There is no acute fracture or dislocation. Femoroacetabular alignment is maintained bilaterally. There is mild degenerative change about both hips. The SI joints and symphysis pubis are intact. The soft tissues are unremarkable. Femur: There is no acute fracture or dislocation. Bony alignment is normal. The soft tissues are unremarkable. Knee: There is no acute fracture or dislocation. Knee alignment is normal. There is moderate tricompartmental joint space narrowing with associated subchondral sclerosis and osteophytosis, consistent with osteoarthritis. There is chondrocalcinosis in the knee joint. The soft tissues are unremarkable. There is no effusion. IMPRESSION: 1. No evidence of acute injury in the pelvis, femur, or knee. 2. Moderate tricompartmental osteoarthritis about the knee. 3. 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 Electronically Signed   By: Valetta Mole M.D.   On: 10/10/2021 18:42   DG Knee Complete 4 Views Left  Result Date: 10/10/2021 CLINICAL DATA:  Fall 2 months ago.  Left leg pain. EXAM: PELVIS - 1-2 VIEW; LEFT FEMUR  2 VIEWS; LEFT KNEE - COMPLETE 4+ VIEW COMPARISON:  None Available. FINDINGS: Pelvis: There is no acute fracture or dislocation. Femoroacetabular alignment is maintained bilaterally. There is mild degenerative change about both hips. The SI joints and symphysis pubis are intact. The soft tissues are unremarkable. Femur: There is no acute fracture or dislocation. Bony alignment is normal. The soft tissues are unremarkable. Knee: There is no acute fracture or dislocation. Knee alignment is normal. There is moderate tricompartmental joint space narrowing with associated subchondral sclerosis and osteophytosis, consistent with osteoarthritis.  There is chondrocalcinosis in the knee joint. The soft tissues are unremarkable. There is no effusion. IMPRESSION: 1. No evidence of acute injury in the pelvis, femur, or knee. 2. Moderate tricompartmental osteoarthritis about the knee. 3. 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 Electronically Signed   By: Lesia Hausen M.D.   On: 10/10/2021 18:42    Assessment/Plan 1. Chronic left hip pain Chronic  Continue on Tylenol and voltaren gel  - Ambulatory referral to Orthopedic Surgery  2. Primary osteoarthritis of both knees Continue on tylenol and Voltaren gel - Ambulatory referral to Orthopedic Surgery  3. Need for influenza vaccination Afebrile  Flut shot administered by CMA no acute reaction reported.  - Flu Vaccine QUAD High Dose(Fluad)  4. Slow transit constipation Colace ineffective  Start on senna- s as below and Miralax  - continue to encourage fiber and hydration  - senna-docusate (SENOKOT-S) 8.6-50 MG tablet; Take 1 tablet by mouth at bedtime.  Dispense: 30 tablet; Refill: 3   Family/ staff Communication: Reviewed plan of care with patient and daughter verbalized understanding   Labs/tests ordered: None   Next Appointment: Has appointment 12/31/2021 for 6 months follow up in place.   Caesar Bookman, NP

## 2021-10-23 ENCOUNTER — Other Ambulatory Visit: Payer: Self-pay | Admitting: *Deleted

## 2021-10-23 MED ORDER — ELIQUIS 5 MG PO TABS
5.0000 mg | ORAL_TABLET | Freq: Two times a day (BID) | ORAL | 1 refills | Status: AC
Start: 2021-10-23 — End: ?

## 2021-10-23 NOTE — Telephone Encounter (Signed)
Patient caregiver requested refill.  

## 2021-10-28 ENCOUNTER — Telehealth: Payer: Self-pay

## 2021-10-28 ENCOUNTER — Ambulatory Visit (INDEPENDENT_AMBULATORY_CARE_PROVIDER_SITE_OTHER): Payer: Medicare Other | Admitting: Orthopaedic Surgery

## 2021-10-28 ENCOUNTER — Encounter: Payer: Self-pay | Admitting: Orthopaedic Surgery

## 2021-10-28 ENCOUNTER — Ambulatory Visit (INDEPENDENT_AMBULATORY_CARE_PROVIDER_SITE_OTHER): Payer: Medicare Other

## 2021-10-28 DIAGNOSIS — G8929 Other chronic pain: Secondary | ICD-10-CM

## 2021-10-28 DIAGNOSIS — M25561 Pain in right knee: Secondary | ICD-10-CM

## 2021-10-28 NOTE — Telephone Encounter (Signed)
Please precert for bilateral synvisc. Dr.Xu's patient.

## 2021-10-28 NOTE — Progress Notes (Signed)
Office Visit Note   Patient: Carla Gregory           Date of Birth: March 04, 1940           MRN: [id number] Visit Date: 10/28/2021              Requested by: Sandrea Hughs, NP [address] [place],  [place] [address] PCP: Sandrea Hughs, NP   Assessment & Plan: Visit Diagnoses:  1. 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  For the knees she would like to get authorization for gel injections again.  For the back she is not interested in any interventions at this time.  We will see her back for the gel injections.  This patient is diagnosed with osteoarthritis of the knee(s).    Radiographs show evidence of joint space narrowing, osteophytes, subchondral sclerosis and/or subchondral cysts.  This patient has knee pain which interferes with functional and activities of daily living.    This patient has experienced inadequate response, adverse effects and/or intolerance with conservative treatments such as acetaminophen, NSAIDS, topical creams, physical therapy or regular exercise, knee bracing and/or weight loss.   This patient has experienced inadequate response or has a contraindication to intra articular steroid injections for at least 3 months.   This patient is not scheduled to have a total knee replacement within 6 months of starting treatment with viscosupplementation.  Follow-Up Instructions: No follow-ups on file.   Orders:  Orders Placed This Encounter  Procedures   XR KNEE 3 VIEW RIGHT   No orders of the defined types were placed in this encounter.     Procedures: No procedures performed   Clinical Data: No additional findings.   Subjective: Chief Complaint  Patient presents with   Left Knee - Pain   Right Knee - Pain    HPI Carla Gregory is a 81 year old female here for bilateral knee pain worse on the right for years since.  Started walking with a rollator a couple months ago.  Denies any  injuries.  Takes Eliquis on a daily basis.  Recently moved to [place] to live with her daughter-in-law.  Also has complaints of lumbar radiculopathy which she has had injections for in the past.  She is also gel injections in the past in her knees with good relief.  She has had cortisone injections which were previously effective.  Review of Systems  Constitutional: Negative.   HENT: Negative.    Eyes: Negative.   Respiratory: Negative.    Cardiovascular: Negative.   Endocrine: Negative.   Musculoskeletal: Negative.   Neurological: Negative.   Hematological: Negative.   Psychiatric/Behavioral: Negative.    All other systems reviewed and are negative.    Objective: Vital Signs: There were no vitals taken for this visit.  Physical Exam Vitals and nursing note reviewed.  Constitutional:      Appearance: She is well-developed.  HENT:     Head: Atraumatic.     Nose: Nose normal.  Eyes:     Extraocular Movements: Extraocular movements intact.  Cardiovascular:     Pulses: Normal pulses.  Pulmonary:     Effort: Pulmonary effort is normal.  Abdominal:     Palpations: Abdomen is soft.  Musculoskeletal:     Cervical back: Neck supple.  Skin:    General: Skin is warm.     Capillary Refill: Capillary refill takes less than 2 seconds.  Neurological:  Mental Status: She is alert. Mental status is at baseline.  Psychiatric:        Behavior: Behavior normal.        Thought Content: Thought content normal.        Judgment: Judgment normal.     Ortho Exam Examination of bilateral knees show soft tissue edema.  No joint effusion.  There is pain and crepitus with range of motion.  Collaterals and cruciates are stable.  Joint line tenderness. Specialty Comments:  No specialty comments available.  Imaging: XR KNEE 3 VIEW RIGHT  Result Date: 10/28/2021 Advanced tricompartmental degenerative joint disease.  Bone-on-bone joint space narrowing.    PMFS History: Patient Active  Problem List   Diagnosis Date Noted   Primary osteoarthritis of both knees 10/16/2021   Chronic left hip pain 10/16/2021   Slow transit constipation 10/16/2021   Hyperlipidemia LDL goal <100 07/01/2021   Chronic systolic congestive heart failure (HCC) 07/01/2021   Essential Hypertension    Past Medical History:  Diagnosis Date   Allergic rhinitis, unspecified    Anemia, unspecified    Bilateral primary osteoarthritis of knee    Body mass index 34.0-34.9, adult    Cardiac murmur, unspecified    Carpal tunnel syndrome, right upper limb    Cervicalgia    Chronic musculoskeletal pain    Disorder of the skin and subcutaneous tissue, unspecified    History of mammogram 2021   Hypertension    Osteoarthritis of knee    Other abnormal glucose    Other dorsalgia    Other hyperlipidemia    Other myositis, unspecified shoulder    Pain in left hip    Pain in left knee    Pain in unspecified knee    Polymyalgia rheumatica (HCC)    Postnasal drip    Primary generalized (osteo)arthritis    Primary osteoarthritis of knee    Sciatica of left side    Sleep apnea    Vitamin D deficiency     Family History  Problem Relation Age of Onset   Hypertension Mother    Cancer Mother    Cancer Father    Cancer Sister 60   Cancer Brother 61   Clotting disorder Daughter 27       Blood clot in heart    History reviewed. No pertinent surgical history. Social History   Occupational History   Occupation: Retired  Tobacco Use   Smoking status: Former    Types: Cigarettes   Smokeless tobacco: Never  Building services engineer Use: Never used  Substance and Sexual Activity   Alcohol use: Never   Drug use: Never   Sexual activity: Not on file

## 2021-10-29 NOTE — Telephone Encounter (Signed)
VOB submitted for Orthovisc, bilateral knee 

## 2021-11-11 ENCOUNTER — Ambulatory Visit: Payer: Medicare Other | Admitting: Cardiovascular Disease

## 2021-12-31 ENCOUNTER — Ambulatory Visit: Payer: Medicare Other | Admitting: Family

## 2022-01-09 ENCOUNTER — Ambulatory Visit: Payer: Medicare Other | Admitting: Physician Assistant

## 2022-02-13 ENCOUNTER — Ambulatory Visit: Payer: Medicare Other | Admitting: Cardiovascular Disease

## 2023-01-13 DEATH — deceased
